# Patient Record
Sex: Female | Born: 1945
Health system: Southern US, Community
[De-identification: ages and names within clinical notes are randomized; demographics above are authoritative.]

## PROBLEM LIST (undated history)

## (undated) DIAGNOSIS — T783XXA Angioneurotic edema, initial encounter: Secondary | ICD-10-CM

## (undated) DIAGNOSIS — I10 Essential (primary) hypertension: Secondary | ICD-10-CM

## (undated) DIAGNOSIS — M8580 Other specified disorders of bone density and structure, unspecified site: Secondary | ICD-10-CM

## (undated) DIAGNOSIS — E7800 Pure hypercholesterolemia, unspecified: Secondary | ICD-10-CM

## (undated) DIAGNOSIS — T7840XA Allergy, unspecified, initial encounter: Secondary | ICD-10-CM

## (undated) DIAGNOSIS — K219 Gastro-esophageal reflux disease without esophagitis: Secondary | ICD-10-CM

## (undated) HISTORY — DX: Other specified disorders of bone density and structure, unspecified site: M85.80

## (undated) HISTORY — DX: Angioneurotic edema, initial encounter: T78.3XXA

## (undated) HISTORY — DX: Essential (primary) hypertension: I10

## (undated) HISTORY — DX: Gastro-esophageal reflux disease without esophagitis: K21.9

## (undated) HISTORY — PX: TUBAL LIGATION: SHX77

## (undated) HISTORY — DX: Pure hypercholesterolemia, unspecified: E78.00

## (undated) HISTORY — DX: Allergy, unspecified, initial encounter: T78.40XA

---

## 2000-02-10 ENCOUNTER — Other Ambulatory Visit: Admission: RE | Admit: 2000-02-10 | Discharge: 2000-02-10 | Payer: Self-pay | Admitting: Gynecology

## 2000-03-04 ENCOUNTER — Other Ambulatory Visit: Admission: RE | Admit: 2000-03-04 | Discharge: 2000-03-04 | Payer: Self-pay | Admitting: Gynecology

## 2000-03-04 ENCOUNTER — Encounter (INDEPENDENT_AMBULATORY_CARE_PROVIDER_SITE_OTHER): Payer: Self-pay | Admitting: Specialist

## 2001-02-11 ENCOUNTER — Other Ambulatory Visit: Admission: RE | Admit: 2001-02-11 | Discharge: 2001-02-11 | Payer: Self-pay | Admitting: Gynecology

## 2001-07-21 ENCOUNTER — Encounter: Admission: RE | Admit: 2001-07-21 | Discharge: 2001-07-21 | Payer: Self-pay | Admitting: General Surgery

## 2001-07-21 ENCOUNTER — Encounter: Payer: Self-pay | Admitting: General Surgery

## 2002-02-15 ENCOUNTER — Encounter: Admission: RE | Admit: 2002-02-15 | Discharge: 2002-02-15 | Payer: Self-pay | Admitting: Gynecology

## 2002-02-15 ENCOUNTER — Encounter: Payer: Self-pay | Admitting: Gynecology

## 2002-09-05 ENCOUNTER — Other Ambulatory Visit: Admission: RE | Admit: 2002-09-05 | Discharge: 2002-09-05 | Payer: Self-pay | Admitting: Gynecology

## 2004-03-28 ENCOUNTER — Encounter: Admission: RE | Admit: 2004-03-28 | Discharge: 2004-03-28 | Payer: Self-pay | Admitting: Gynecology

## 2004-04-21 ENCOUNTER — Other Ambulatory Visit: Admission: RE | Admit: 2004-04-21 | Discharge: 2004-04-21 | Payer: Self-pay | Admitting: Gynecology

## 2005-04-22 ENCOUNTER — Encounter: Admission: RE | Admit: 2005-04-22 | Discharge: 2005-04-22 | Payer: Self-pay | Admitting: Gynecology

## 2005-04-22 ENCOUNTER — Other Ambulatory Visit: Admission: RE | Admit: 2005-04-22 | Discharge: 2005-04-22 | Payer: Self-pay | Admitting: Gynecology

## 2005-06-29 ENCOUNTER — Emergency Department (HOSPITAL_COMMUNITY): Admission: EM | Admit: 2005-06-29 | Discharge: 2005-06-29 | Payer: Self-pay | Admitting: Emergency Medicine

## 2006-06-04 ENCOUNTER — Encounter: Admission: RE | Admit: 2006-06-04 | Discharge: 2006-06-04 | Payer: Self-pay | Admitting: Gynecology

## 2006-06-07 ENCOUNTER — Other Ambulatory Visit: Admission: RE | Admit: 2006-06-07 | Discharge: 2006-06-07 | Payer: Self-pay | Admitting: Gynecology

## 2006-07-28 IMAGING — CR DG CHEST 1V PORT
1 series · 1 of 1 positions shown · non-contrast
Comparison: None.

CLINICAL DATA: Sharp chest pain. 
 PORTABLE CHEST ? 06/29/05:

[view not recorded]
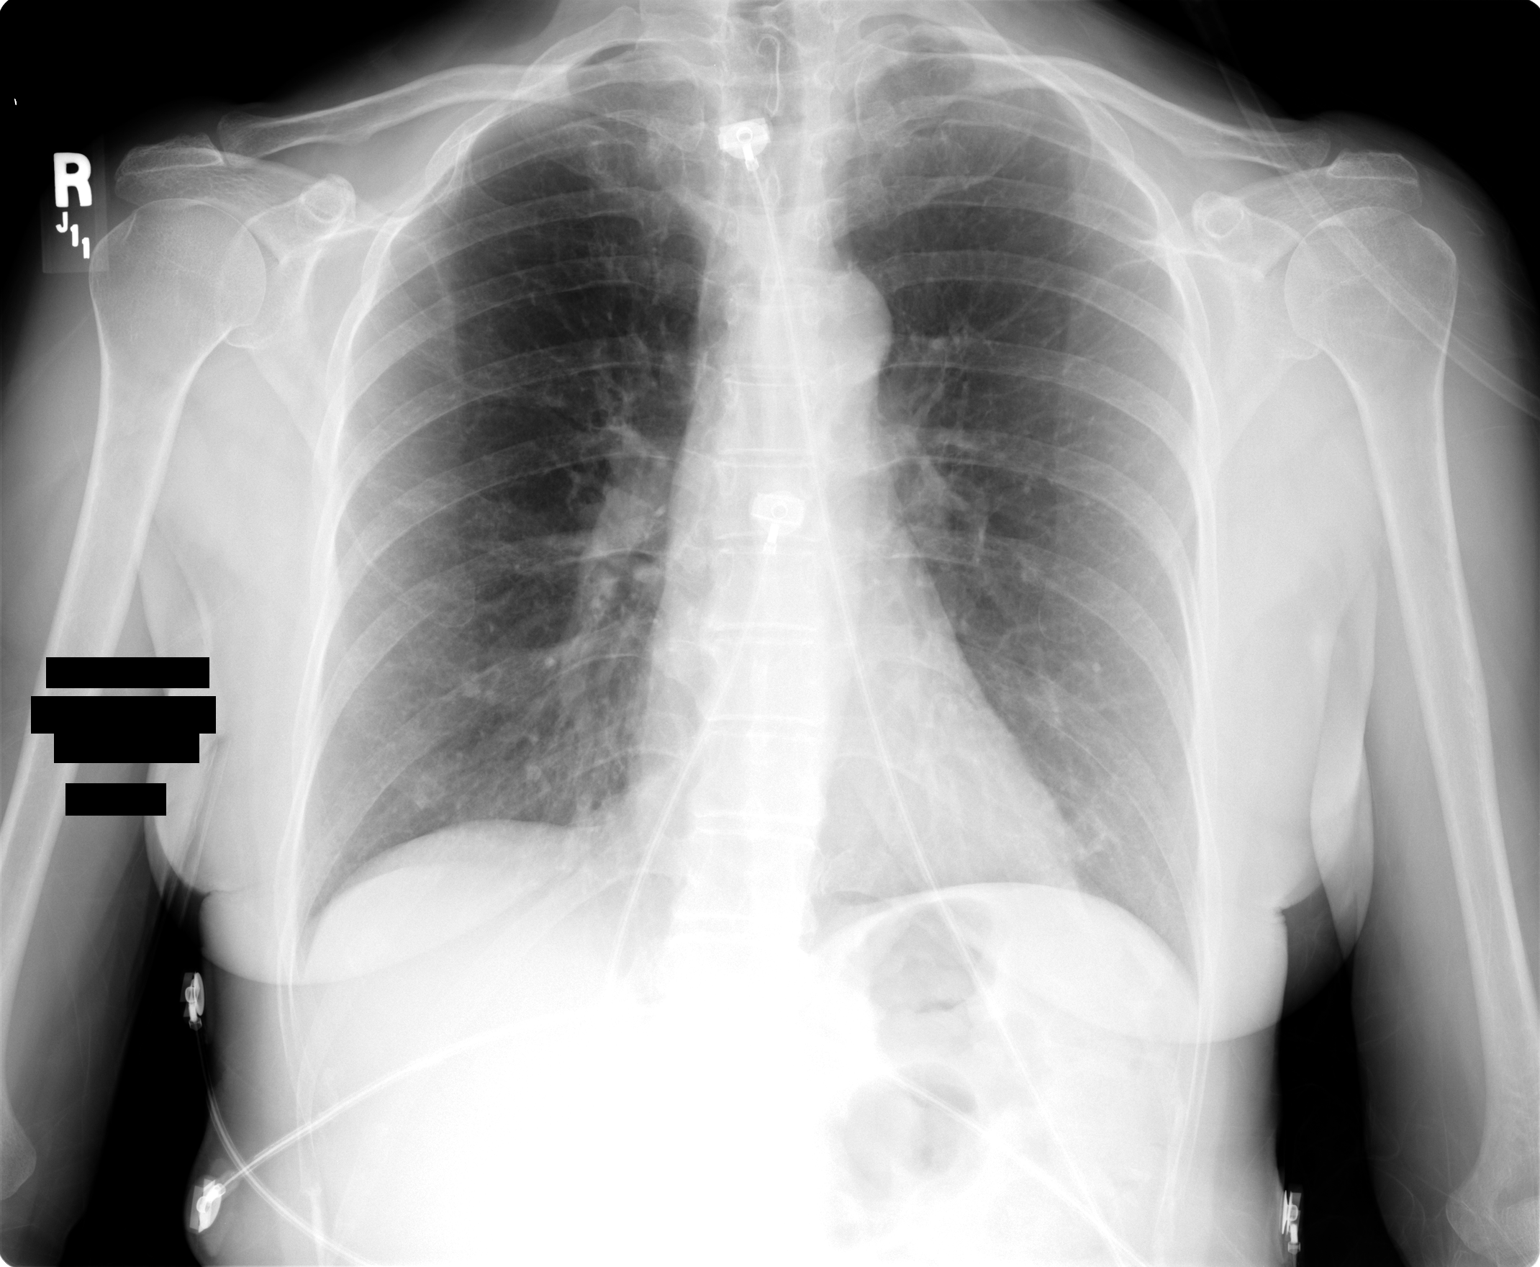

[1 of 1 positions shown; findings below may reference images not displayed]

The heart size and mediastinal contours are within normal limits.  Both lungs are clear.  Apparent opacities over the bilateral lower lobes most likely represent costochondral calcification involving the anterior 6th ribs bilaterally.
IMPRESSION: No acute findings.

## 2007-07-01 ENCOUNTER — Other Ambulatory Visit: Admission: RE | Admit: 2007-07-01 | Discharge: 2007-07-01 | Payer: Self-pay | Admitting: Gynecology

## 2007-07-08 ENCOUNTER — Encounter: Admission: RE | Admit: 2007-07-08 | Discharge: 2007-07-08 | Payer: Self-pay | Admitting: Gynecology

## 2008-04-22 ENCOUNTER — Emergency Department (HOSPITAL_COMMUNITY): Admission: EM | Admit: 2008-04-22 | Discharge: 2008-04-23 | Payer: Self-pay | Admitting: Emergency Medicine

## 2008-04-29 ENCOUNTER — Emergency Department (HOSPITAL_COMMUNITY): Admission: EM | Admit: 2008-04-29 | Discharge: 2008-04-29 | Payer: Self-pay | Admitting: Emergency Medicine

## 2008-07-09 ENCOUNTER — Ambulatory Visit: Payer: Self-pay | Admitting: Women's Health

## 2008-07-09 ENCOUNTER — Other Ambulatory Visit: Admission: RE | Admit: 2008-07-09 | Discharge: 2008-07-09 | Payer: Self-pay | Admitting: Gynecology

## 2008-07-09 ENCOUNTER — Encounter: Payer: Self-pay | Admitting: Women's Health

## 2008-07-09 ENCOUNTER — Encounter: Admission: RE | Admit: 2008-07-09 | Discharge: 2008-07-09 | Payer: Self-pay | Admitting: Gynecology

## 2008-07-23 ENCOUNTER — Ambulatory Visit: Payer: Self-pay | Admitting: Women's Health

## 2008-07-31 ENCOUNTER — Ambulatory Visit: Payer: Self-pay | Admitting: Gynecology

## 2009-07-10 ENCOUNTER — Encounter: Admission: RE | Admit: 2009-07-10 | Discharge: 2009-07-10 | Payer: Self-pay | Admitting: Gynecology

## 2009-07-10 ENCOUNTER — Encounter: Payer: Self-pay | Admitting: Women's Health

## 2009-07-10 ENCOUNTER — Ambulatory Visit: Payer: Self-pay | Admitting: Women's Health

## 2009-07-10 ENCOUNTER — Other Ambulatory Visit: Admission: RE | Admit: 2009-07-10 | Discharge: 2009-07-10 | Payer: Self-pay | Admitting: Gynecology

## 2010-07-11 ENCOUNTER — Encounter: Admission: RE | Admit: 2010-07-11 | Discharge: 2010-07-11 | Payer: Self-pay | Admitting: Obstetrics and Gynecology

## 2010-07-11 ENCOUNTER — Other Ambulatory Visit: Admission: RE | Admit: 2010-07-11 | Discharge: 2010-07-11 | Payer: Self-pay | Admitting: Gynecology

## 2010-07-11 ENCOUNTER — Ambulatory Visit: Payer: Self-pay | Admitting: Women's Health

## 2010-08-19 ENCOUNTER — Ambulatory Visit: Payer: Self-pay | Admitting: Women's Health

## 2010-09-28 HISTORY — PX: SKIN CANCER DESTRUCTION: SHX778

## 2010-09-28 HISTORY — PX: BREAST BIOPSY: SHX20

## 2010-09-30 ENCOUNTER — Ambulatory Visit
Admission: RE | Admit: 2010-09-30 | Discharge: 2010-09-30 | Payer: Self-pay | Source: Home / Self Care | Attending: Gynecology | Admitting: Gynecology

## 2011-02-13 NOTE — Procedures (Signed)
NAME:  Jackie Jackson, [name]               ACCOUNT NO.:  [id number]   MEDICAL RECORD NO.:  [id number]          PATIENT TYPE:  EMS   LOCATION:  ED                            FACILITY:  [facility]   PHYSICIAN:  Edward L. Juanetta Gosling, M.D.DATE OF BIRTH:  [birth year]   DATE OF PROCEDURE:  DATE OF DISCHARGE:  06/29/2005                                EKG INTERPRETATION   Time is 11:19 June 29, 2005. The rhythm is sinus rhythm with a rate in the  70s. There is inferior ST elevation which is nonspecific. Abnormal  electrocardiogram.      Oneal Deputy. Juanetta Gosling, M.D.  Electronically Signed     ELH/MEDQ  D:  07/08/2005  T:  07/08/2005  Job:  [id number]

## 2011-02-13 NOTE — Procedures (Signed)
NAME:  Jackie Jackson, Jackie Jackson               ACCOUNT NO.:  [id number]   MEDICAL RECORD NO.:  [id number]          PATIENT TYPE:  EMS   LOCATION:  ED                            FACILITY:  [facility]   PHYSICIAN:  Edward L. Juanetta Gosling, M.D.DATE OF BIRTH:  1946-06-04   DATE OF PROCEDURE:  DATE OF DISCHARGE:  06/29/2005                                EKG INTERPRETATION   Time 11:19, June 29, 2005. The rhythm is sinus rhythm with a rate in the  70s. There is inferior ST elevation, but no other ST-T wave changes to  suggest that this might represent a myocardial infarction, and my impression  is that this is probably early repolarization artifact, etc. Clinical  correlation is however suggested. Abnormal electrocardiogram.      Oneal Deputy. Juanetta Gosling, M.D.  Electronically Signed     ELH/MEDQ  D:  07/08/2005  T:  07/09/2005  Job:  [id number]

## 2011-06-26 LAB — POCT CARDIAC MARKERS
CKMB, poc: <1 — ABNORMAL LOW
CKMB, poc: <1 — ABNORMAL LOW

## 2011-07-20 ENCOUNTER — Other Ambulatory Visit: Payer: Self-pay | Admitting: Women's Health

## 2011-07-20 DIAGNOSIS — Z1231 Encounter for screening mammogram for malignant neoplasm of breast: Secondary | ICD-10-CM

## 2011-08-04 ENCOUNTER — Encounter: Payer: Self-pay | Admitting: *Deleted

## 2011-08-04 DIAGNOSIS — M8580 Other specified disorders of bone density and structure, unspecified site: Secondary | ICD-10-CM | POA: Insufficient documentation

## 2011-08-04 DIAGNOSIS — E7800 Pure hypercholesterolemia, unspecified: Secondary | ICD-10-CM | POA: Insufficient documentation

## 2011-08-04 DIAGNOSIS — K219 Gastro-esophageal reflux disease without esophagitis: Secondary | ICD-10-CM | POA: Insufficient documentation

## 2011-08-05 ENCOUNTER — Ambulatory Visit
Admission: RE | Admit: 2011-08-05 | Discharge: 2011-08-05 | Disposition: A | Discharge status: Home / Self Care | Payer: Medicare Other | Source: Ambulatory Visit | Attending: Women's Health | Admitting: Women's Health

## 2011-08-05 ENCOUNTER — Other Ambulatory Visit (HOSPITAL_COMMUNITY)
Admission: RE | Admit: 2011-08-05 | Discharge: 2011-08-05 | Disposition: A | Discharge status: Home / Self Care | Payer: Medicare Other | Source: Ambulatory Visit | Attending: Gynecology | Admitting: Gynecology

## 2011-08-05 ENCOUNTER — Ambulatory Visit (INDEPENDENT_AMBULATORY_CARE_PROVIDER_SITE_OTHER): Payer: Medicare Other | Admitting: Women's Health

## 2011-08-05 ENCOUNTER — Encounter: Payer: Self-pay | Admitting: Women's Health

## 2011-08-05 VITALS — BP 130/70 | Ht 62.25 in | Wt 136.0 lb

## 2011-08-05 DIAGNOSIS — C801 Malignant (primary) neoplasm, unspecified: Secondary | ICD-10-CM | POA: Insufficient documentation

## 2011-08-05 DIAGNOSIS — Z1231 Encounter for screening mammogram for malignant neoplasm of breast: Secondary | ICD-10-CM

## 2011-08-05 DIAGNOSIS — Z124 Encounter for screening for malignant neoplasm of cervix: Secondary | ICD-10-CM

## 2011-08-05 DIAGNOSIS — N951 Menopausal and female climacteric states: Secondary | ICD-10-CM

## 2011-08-05 DIAGNOSIS — Z01419 Encounter for gynecological examination (general) (routine) without abnormal findings: Secondary | ICD-10-CM

## 2011-08-05 NOTE — Progress Notes (Signed)
Jackie Jackson June 21, 1946 [id number]    History:    The patient presents for breast exam and Pap.   Past medical history, past surgical history, family history and social history were all reviewed and documented in the EPIC chart.   ROS:  A  ROS was performed and pertinent positives and negatives are included in the history.  Exam:  Filed Vitals:   08/05/11 1050  BP: 130/70    General appearance:  Normal Head/Neck:  Normal, without cervical or supraclavicular adenopathy. Thyroid:  Symmetrical, normal in size, without palpable masses or nodularity. Respiratory  Effort:  Normal  Auscultation:  Clear without wheezing or rhonchi Cardiovascular  Auscultation:  Regular rate, without rubs, murmurs or gallops  Edema/varicosities:  Not grossly evident Abdominal  Soft,nontender, without masses, guarding or rebound.  Liver/spleen:  No organomegaly noted  Hernia:  None appreciated  Skin  Inspection:  Grossly normal  Palpation:  Grossly normal Neurologic/psychiatric  Orientation:  Normal with appropriate conversation.  Mood/affect:  Normal  Genitourinary    Breasts: Examined lying and sitting.     Right: Without masses, retractions, discharge or axillary adenopathy.     Left: Without masses, retractions, discharge or axillary adenopathy.   Inguinal/mons:  Normal without inguinal adenopathy  External genitalia:  Normal  BUS/Urethra/Skene's glands:  Normal  Bladder:  Normal  Vagina:  Normal  Cervix:  Normal  Uterus:  retroverted, normal in size, shape and contour.  Midline and mobile  Adnexa/parametria:     Rt: Without masses or tenderness.   Lt: Without masses or tenderness.  Anus and perineum: Normal  Digital rectal exam: Normal sphincter tone without palpated masses or tenderness  Assessment/Plan:  65 y.o.[name] G2P2 postmenopausal with no bleeding on no HRT. Diagnosis this year with skin cancer she thinks is basal, she does have followup with Dr. Arbie Jackson and Jackie Jackson and will  continue every 3-6 months.  Not sexually active due to her husband's health. History of normal Paps and mammograms.  Normal Postmenopausal exam Osteopenia T score -1.9 Hyperlipidemia/acid reflux-labs and meds at primary care  Plan: DEXA, repeat 11/ 2013. Home safety and fall prevention discussed. Reviewed importance of exercise, calcium rich diet, vitamin D 1000 daily. SBEs, annual mammogram which she is having today. She has not had a colonoscopy will schedule this year. Instructed to followup with primary care for zostovac and Pneumovax vaccine. Pap only today labs are done at primary care.    Harrington Challenger [facility], 11:41 AM 08/05/2011

## 2011-08-05 NOTE — Patient Instructions (Signed)
Pnuemovac/ Pnuemonia vaccine  zostovac

## 2011-08-13 ENCOUNTER — Other Ambulatory Visit: Payer: Self-pay

## 2011-08-13 DIAGNOSIS — N63 Unspecified lump in unspecified breast: Secondary | ICD-10-CM

## 2011-08-28 ENCOUNTER — Ambulatory Visit
Admission: RE | Admit: 2011-08-28 | Discharge: 2011-08-28 | Disposition: A | Discharge status: Home / Self Care | Payer: Medicare Other | Source: Ambulatory Visit | Attending: Gynecology | Admitting: Gynecology

## 2011-08-28 ENCOUNTER — Other Ambulatory Visit: Payer: Self-pay | Admitting: Gynecology

## 2011-08-28 DIAGNOSIS — N63 Unspecified lump in unspecified breast: Secondary | ICD-10-CM

## 2012-01-27 ENCOUNTER — Other Ambulatory Visit: Payer: Self-pay | Admitting: Physician Assistant

## 2012-07-21 ENCOUNTER — Other Ambulatory Visit: Payer: Self-pay | Admitting: Women's Health

## 2012-07-21 DIAGNOSIS — Z1231 Encounter for screening mammogram for malignant neoplasm of breast: Secondary | ICD-10-CM

## 2012-08-17 ENCOUNTER — Encounter: Payer: Self-pay | Admitting: Women's Health

## 2012-08-17 ENCOUNTER — Encounter: Payer: Self-pay | Admitting: Gynecology

## 2012-08-17 ENCOUNTER — Ambulatory Visit (INDEPENDENT_AMBULATORY_CARE_PROVIDER_SITE_OTHER): Payer: Medicare Other | Admitting: Women's Health

## 2012-08-17 VITALS — BP 136/78 | Ht 62.5 in | Wt 139.0 lb

## 2012-08-17 DIAGNOSIS — M8580 Other specified disorders of bone density and structure, unspecified site: Secondary | ICD-10-CM

## 2012-08-17 DIAGNOSIS — M949 Disorder of cartilage, unspecified: Secondary | ICD-10-CM

## 2012-08-17 NOTE — Patient Instructions (Addendum)

## 2012-08-17 NOTE — Progress Notes (Signed)
Jackie Jackson August 24, 1946 [id number]    History:    The patient presents for breast and pelvic exam. Postmenopausal with no bleeding on no HRT. History of normal Paps. Mammogram 2012 negative biopsy. History of basal skin cancer on lower leg and nose. Osteopenia T score -1.8 at the [facility] spine in 2011 with no increased fracture risk, normal calcium, vitamin D, and PTH. History of hypercholesteremia currently on Lipitor per primary care. Not sexually active due to husbands health.  Past medical history, past surgical history, family history and social history were all reviewed and documented in the EPIC chart. Retired from [employer]. Helping care for 20 yo mother who had heart surgery.  Exam:  Filed Vitals:   08/17/12 1056  BP: 136/78    General appearance:  Normal Head/Neck:  Normal, without cervical or supraclavicular adenopathy. Thyroid:  Symmetrical, normal in size, without palpable masses or nodularity. Respiratory  Effort:  Normal  Auscultation:  Clear without wheezing or rhonchi Cardiovascular  Auscultation:  Regular rate, without rubs, murmurs or gallops  Edema/varicosities:  Not grossly evident Abdominal  Soft,nontender, without masses, guarding or rebound.  Liver/spleen:  No organomegaly noted  Hernia:  None appreciated  Skin  Inspection:  Grossly normal  Palpation:  Grossly normal Neurologic/psychiatric  Orientation:  Normal with appropriate conversation.  Mood/affect:  Normal  Genitourinary    Breasts: Examined lying and sitting.     Right: Without masses, retractions, discharge or axillary adenopathy.     Left: Without masses, retractions, discharge or axillary adenopathy.   Inguinal/mons:  Normal without inguinal adenopathy  External genitalia:  Normal  BUS/Urethra/Skene's glands:  Normal  Bladder:  Normal  Vagina:  Normal  Cervix:  Normal  Uterus:  normal in size, shape and contour.  Midline and mobile  Adnexa/parametria:     Rt: Without masses or  tenderness.   Lt: Without masses or tenderness.  Anus and perineum: Normal  Digital rectal exam: Normal sphincter tone without palpated masses or tenderness  Assessment/Plan:  65 y.o. M. WF G2 P2  for breast and pelvic exam.   Normal post menopausal exam Osteopenia T score -1.9 at right femoral neck with no increased fracture risk Acid reflux-primary care Hypercholesteremia-Lipitor primary care labs and meds  Plan: Instructed to schedule colonoscopy states had to cancel due to mother's health. Is aware of importance and will reschedule. SBE's, continue annual mammogram, calcium rich diet, vitamin D 2000 daily and continue regular exercise encouraged. Labs at primary care, Pap normal 2012, reviewed new screening guidelines. Repeat DEXA, will schedule. Home safety and fall prevention discussed. Reviewed Pneumovax and Zostavac vaccine.    Harrington Challenger [facility], 11:59 AM 08/17/2012

## 2012-08-24 ENCOUNTER — Ambulatory Visit
Admission: RE | Admit: 2012-08-24 | Discharge: 2012-08-24 | Disposition: A | Discharge status: Home / Self Care | Payer: Medicare Other | Source: Ambulatory Visit | Attending: Women's Health | Admitting: Women's Health

## 2012-08-24 DIAGNOSIS — Z1231 Encounter for screening mammogram for malignant neoplasm of breast: Secondary | ICD-10-CM

## 2013-03-07 ENCOUNTER — Encounter (HOSPITAL_COMMUNITY): Payer: Self-pay

## 2013-03-07 ENCOUNTER — Emergency Department (HOSPITAL_COMMUNITY)
Admission: EM | Admit: 2013-03-07 | Discharge: 2013-03-07 | Disposition: A | Discharge status: Home / Self Care | Payer: Medicare PPO | Attending: Emergency Medicine | Admitting: Emergency Medicine

## 2013-03-07 ENCOUNTER — Emergency Department (HOSPITAL_COMMUNITY): Payer: Medicare PPO

## 2013-03-07 DIAGNOSIS — E7800 Pure hypercholesterolemia, unspecified: Secondary | ICD-10-CM | POA: Insufficient documentation

## 2013-03-07 DIAGNOSIS — R0602 Shortness of breath: Secondary | ICD-10-CM | POA: Insufficient documentation

## 2013-03-07 DIAGNOSIS — Z8739 Personal history of other diseases of the musculoskeletal system and connective tissue: Secondary | ICD-10-CM | POA: Insufficient documentation

## 2013-03-07 DIAGNOSIS — R072 Precordial pain: Secondary | ICD-10-CM | POA: Insufficient documentation

## 2013-03-07 DIAGNOSIS — R079 Chest pain, unspecified: Secondary | ICD-10-CM

## 2013-03-07 DIAGNOSIS — Z79899 Other long term (current) drug therapy: Secondary | ICD-10-CM | POA: Insufficient documentation

## 2013-03-07 DIAGNOSIS — Z859 Personal history of malignant neoplasm, unspecified: Secondary | ICD-10-CM | POA: Insufficient documentation

## 2013-03-07 DIAGNOSIS — K219 Gastro-esophageal reflux disease without esophagitis: Secondary | ICD-10-CM | POA: Insufficient documentation

## 2013-03-07 DIAGNOSIS — M549 Dorsalgia, unspecified: Secondary | ICD-10-CM | POA: Insufficient documentation

## 2013-03-07 LAB — CBC WITH DIFFERENTIAL/PLATELET
Basophils Absolute: 0 10*3/uL (ref 0.0–0.1)
Basophils Relative: 1 % (ref 0–1)
Eosinophils Absolute: 0.1 10*3/uL (ref 0.0–0.7)
Eosinophils Relative: 1 % (ref 0–5)
HCT: 40.3 % (ref 36.0–46.0)
Hemoglobin: 13.6 g/dL (ref 12.0–15.0)
Lymphocytes Relative: 47 % — ABNORMAL HIGH (ref 12–46)
Lymphs Abs: 3 10*3/uL (ref 0.7–4.0)
MCH: 30.4 pg (ref 26.0–34.0)
MCHC: 33.7 g/dL (ref 30.0–36.0)
MCV: 90.2 fL (ref 78.0–100.0)
Monocytes Absolute: 0.5 10*3/uL (ref 0.1–1.0)
Monocytes Relative: 7 % (ref 3–12)
Neutro Abs: 2.9 10*3/uL (ref 1.7–7.7)
Neutrophils Relative %: 44 % (ref 43–77)
Platelets: 284 10*3/uL (ref 150–400)
RBC: 4.47 MIL/uL (ref 3.87–5.11)
RDW: 13.3 % (ref 11.5–15.5)
WBC: 6.4 10*3/uL (ref 4.0–10.5)

## 2013-03-07 LAB — BASIC METABOLIC PANEL
BUN: 14 mg/dL (ref 6–23)
CO2: 28 mEq/L (ref 19–32)
Calcium: 10.2 mg/dL (ref 8.4–10.5)
Chloride: 99 mEq/L (ref 96–112)
Creatinine, Ser: 0.81 mg/dL (ref 0.50–1.10)
GFR calc Af Amer: 85 mL/min — ABNORMAL LOW (ref >90)
GFR calc non Af Amer: 73 mL/min — ABNORMAL LOW (ref >90)
Glucose, Bld: 103 mg/dL — ABNORMAL HIGH (ref 70–99)
Potassium: 4.3 mEq/L (ref 3.5–5.1)
Sodium: 139 mEq/L (ref 135–145)

## 2013-03-07 LAB — TROPONIN I: Troponin I: <0.3 ng/mL (ref <0.30)

## 2013-03-07 LAB — POCT I-STAT TROPONIN I: Troponin i, poc: 0 ng/mL (ref 0.00–0.08)

## 2013-03-07 MED ORDER — IOHEXOL 350 MG/ML SOLN
100.0000 mL | Freq: Once | INTRAVENOUS | Status: AC | PRN
  Administered 2013-03-07: 100 mL via INTRAVENOUS

## 2013-03-07 MED ORDER — GI COCKTAIL ~~LOC~~
30.0000 mL | Freq: Once | ORAL | Status: AC
  Administered 2013-03-07: 30 mL via ORAL
  Filled 2013-03-07: qty 30

## 2013-03-07 NOTE — ED Provider Notes (Signed)
History  This chart was scribed for Jackie Razor, MD by Greggory Stallion, ED Scribe. This patient was seen in room [facility] and the patient's care was started at 9:03 PM.  CSN: [id number]  Arrival date & time 03/07/13  2052    Chief Complaint  Patient presents with  . Chest Pain    The history is provided by the patient. No language interpreter was used.    HPI Comments: Jackie Jackson is a 67 y.o. female with h/o acid reflux who presents to the Emergency Department complaining of sudden onset, constant substernal chest pain that radiated to her back and throat that started earlier tonight around 6 PM after she ate dinner. She states she still has a feeling in her throat but the CP has resolved. She states she took acid reflux medication before she came to the ED. Pt states she is SOB but she states she is always that way. Pt denies fever, leg swelling, neck pain, sore throat, visual disturbance, cough, abdominal pain, nausea, emesis, diarrhea, urinary symptoms, HA, weakness, numbness and rash as associated symptoms. She states she has not had a stress test.   Past Medical History  Diagnosis Date  . Osteopenia     2006 -1.4, 2009 -1.9, 2011 -1.9  . Acid reflux   . High cholesterol   . Cancer 2012    skin cancer    Past Surgical History  Procedure Laterality Date  . Tubal ligation    . Skin cancer destruction  2012    rt. lower leg    Family History  Problem Relation Age of Onset  . Heart disease Father   . Diabetes Brother   . Heart disease Mother     History  Substance Use Topics  . Smoking status: Never Smoker   . Smokeless tobacco: Never Used  . Alcohol Use: No    OB History   Grav Para Term Preterm Abortions TAB SAB Ect Mult Living   2 2        2       Review of Systems  Constitutional: Negative for fever.  HENT: Negative for sore throat and neck pain.   Eyes: Negative for visual disturbance.  Respiratory: Positive for shortness of breath. Negative for  cough.   Cardiovascular: Positive for chest pain. Negative for leg swelling.  Gastrointestinal: Negative for nausea, vomiting, abdominal pain and diarrhea.  Genitourinary: Negative for dysuria and difficulty urinating.  Musculoskeletal: Positive for back pain.  Skin: Negative for rash.  Neurological: Negative for headaches.  All other systems reviewed and are negative.    Allergies  Pantoprazole sodium  Home Medications   Current Outpatient Rx  Name  Route  Sig  Dispense  Refill  . atorvastatin (LIPITOR) 10 MG tablet   Oral   Take 10 mg by mouth daily.           . calcium carbonate (OS-CAL) 600 MG TABS   Oral   Take 600 mg by mouth daily.          . cholecalciferol (VITAMIN D) 1000 UNITS tablet   Oral   Take 1,000 Units by mouth daily.           . fish oil-omega-3 fatty acids 1000 MG capsule   Oral   Take 2 g by mouth daily.           . metoCLOPramide (REGLAN) 10 MG tablet   Oral   Take 10 mg by mouth 4 (four) times daily.           Marland Kitchen  ranitidine (ZANTAC) 150 MG tablet   Oral   Take 150 mg by mouth 2 (two) times daily.           . Red Yeast Rice Extract 600 MG CAPS   Oral   Take by mouth. 2 daily          . vitamin B-12 (CYANOCOBALAMIN) 500 MCG tablet   Oral   Take 500 mcg by mouth daily.             BP 108/81  Pulse 88  Temp(Src) 98.4 F (36.9 C) (Oral)  Resp 20  Ht 5' 2.5" (1.588 m)  Wt 131 lb (59.421 kg)  BMI 23.56 kg/m2  SpO2 97%  LMP 08/04/1997  Physical Exam  Nursing note and vitals reviewed. Constitutional: She appears well-developed and well-nourished. No distress.  HENT:  Head: Normocephalic and atraumatic.  Right Ear: External ear normal.  Left Ear: External ear normal.  Eyes: Conjunctivae are normal. Right eye exhibits no discharge. Left eye exhibits no discharge. No scleral icterus.  Neck: Normal range of motion. Neck supple. No tracheal deviation present.  Cardiovascular: Normal rate, regular rhythm and intact distal  pulses.   Symmetric radial pulses. No significant BP differential in upper extremities.  Pulmonary/Chest: Effort normal and breath sounds normal. No stridor. No respiratory distress. She has no wheezes. She has no rales.  Abdominal: Soft. Bowel sounds are normal. She exhibits no distension. There is no tenderness. There is no rebound and no guarding.  Musculoskeletal: She exhibits no edema and no tenderness.  Neurological: She is alert. She has normal strength. No sensory deficit. Cranial nerve deficit:  no gross defecits noted. She exhibits normal muscle tone. She displays no seizure activity. Coordination normal.  Skin: Skin is warm and dry. No rash noted.  Psychiatric: She has a normal mood and affect.    ED Course  Procedures (including critical care time)  DIAGNOSTIC STUDIES: Oxygen Saturation is 97% on RA, normal by my interpretation.    COORDINATION OF CARE: 9:58 PM-Discussed treatment plan with pt at bedside and pt agreed to plan.   Labs Reviewed  CBC WITH DIFFERENTIAL - Abnormal; Notable for the following:    Lymphocytes Relative 47 (*)    All other components within normal limits  BASIC METABOLIC PANEL - Abnormal; Notable for the following:    Glucose, Bld 103 (*)    GFR calc non Af Amer 73 (*)    GFR calc Af Amer 85 (*)    All other components within normal limits  TROPONIN I  POCT I-STAT TROPONIN I   No results found.  Dg Chest 2 View  03/07/2013   *RADIOLOGY REPORT*  Clinical Data: Substernal chest pain radiating to the back and throat since 1800.  Shortness of breath.  CHEST - 2 VIEW  Comparison: 04/22/2008  Findings: Emphysematous changes and scattered fibrosis in the chest. The heart size and pulmonary vascularity are normal. The lungs appear clear and expanded without focal air space disease or consolidation. No blunting of the costophrenic angles.  No pneumothorax.  Mediastinal contours appear intact.  No significant changes since previous study.  IMPRESSION:  Emphysematous changes in the lungs.  No evidence of active pulmonary disease.   Original Report Authenticated By: Burman Nieves, M.D.   Ct Angio Chest W/cm &/or Wo Cm  03/07/2013   *RADIOLOGY REPORT*  Clinical Data:  Substernal chest pain radiating into the back.  CT ANGIOGRAPHY CHEST, ABDOMEN AND PELVIS  Technique:  Multidetector CT imaging through the  chest, abdomen and pelvis was performed using the standard protocol during bolus administration of intravenous contrast.  Multiplanar reconstructed images including MIPs were obtained and reviewed to evaluate the vascular anatomy.  Contrast: OMNIPAQUE IOHEXOL 350 MG/ML SOLN  Comparison:   None.  CTA CHEST  Findings:  The thoracic aorta is of normal caliber and demonstrates no evidence of aneurysmal disease or dissection.  Unenhanced imaging prior to administration of contrast shows no evidence of intramural hemorrhage.  There is incidental normal variant of an aberrant right subclavian artery which is the last vessel emanating from the aortic arch and has a retroesophageal course.  Other great vessel course and patency is normal.  Nonspecific somewhat prominent appearance of the mid to distal esophagus beginning just below the carina.  Correlation suggested with any symptoms referable to the esophagus.  The esophagus may be thickened in its distal segment.  There is no evidence of pneumomediastinum to suggest esophageal perforation.  The heart size is normal.  No nodules, infiltrates or edema are identified.  No enlarged lymph nodes are seen.  No bony abnormalities are identified.   Review of the MIP images confirms the above findings.  IMPRESSION:  1.  Normal thoracic aorta. 2.  Aberrant right subclavian artery. 3.  Somewhat thickened appearance of the mid to distal esophagus by CT.  This is nonspecific.  Correlation suggested with any symptoms that might be referable to the esophagus.  CTA ABDOMEN AND PELVIS  Findings:  The abdominal aorta is of normal  caliber and shows no evidence of aneurysm or dissection.  Minimal scattered atherosclerotic plaque present.  The origin of the celiac axis shows focal plaque and minimal, roughly 30% stenosis.  The superior mesenteric artery is normally patent.  Two separate right renal arteries and a single left renal artery show normal patency.  The inferior mesenteric artery is normally patent.  Iliac arteries are normally patent bilaterally.  The common femoral arteries show normal patency.  Nonvascular valuation shows no solid organ abnormalities on the arterial phase of imaging.  Bowel loops are normal caliber.  No abnormal fluid collections are seen.  The bladder is unremarkable. No hernias, incidental masses or enlarged lymph nodes are seen. Bony structures are unremarkable.   Review of the MIP images confirms the above findings.  IMPRESSION: No evidence of aortic aneurysm or dissection.   Original Report Authenticated By: Irish Lack, M.D.   Ct Angio Abd/pel W/ And/or W/o  03/07/2013   *RADIOLOGY REPORT*  Clinical Data:  Substernal chest pain radiating into the back.  CT ANGIOGRAPHY CHEST, ABDOMEN AND PELVIS  Technique:  Multidetector CT imaging through the chest, abdomen and pelvis was performed using the standard protocol during bolus administration of intravenous contrast.  Multiplanar reconstructed images including MIPs were obtained and reviewed to evaluate the vascular anatomy.  Contrast: OMNIPAQUE IOHEXOL 350 MG/ML SOLN  Comparison:   None.  CTA CHEST  Findings:  The thoracic aorta is of normal caliber and demonstrates no evidence of aneurysmal disease or dissection.  Unenhanced imaging prior to administration of contrast shows no evidence of intramural hemorrhage.  There is incidental normal variant of an aberrant right subclavian artery which is the last vessel emanating from the aortic arch and has a retroesophageal course.  Other great vessel course and patency is normal.  Nonspecific somewhat  prominent appearance of the mid to distal esophagus beginning just below the carina.  Correlation suggested with any symptoms referable to the esophagus.  The esophagus may be thickened  in its distal segment.  There is no evidence of pneumomediastinum to suggest esophageal perforation.  The heart size is normal.  No nodules, infiltrates or edema are identified.  No enlarged lymph nodes are seen.  No bony abnormalities are identified.   Review of the MIP images confirms the above findings.  IMPRESSION:  1.  Normal thoracic aorta. 2.  Aberrant right subclavian artery. 3.  Somewhat thickened appearance of the mid to distal esophagus by CT.  This is nonspecific.  Correlation suggested with any symptoms that might be referable to the esophagus.  CTA ABDOMEN AND PELVIS  Findings:  The abdominal aorta is of normal caliber and shows no evidence of aneurysm or dissection.  Minimal scattered atherosclerotic plaque present.  The origin of the celiac axis shows focal plaque and minimal, roughly 30% stenosis.  The superior mesenteric artery is normally patent.  Two separate right renal arteries and a single left renal artery show normal patency.  The inferior mesenteric artery is normally patent.  Iliac arteries are normally patent bilaterally.  The common femoral arteries show normal patency.  Nonvascular valuation shows no solid organ abnormalities on the arterial phase of imaging.  Bowel loops are normal caliber.  No abnormal fluid collections are seen.  The bladder is unremarkable. No hernias, incidental masses or enlarged lymph nodes are seen. Bony structures are unremarkable.   Review of the MIP images confirms the above findings.  IMPRESSION: No evidence of aortic aneurysm or dissection.   Original Report Authenticated By: Irish Lack, M.D.   EKG:  Rhythm: normal sinus Vent. rate 91 BPM PR interval 128 ms QRS duration 86 ms QT/QTc 354/435 ms LAE Abnormal r wave progression ST segments: NS ST changes    1.  Chest pain       MDM  67yF with CP. Reports previous episodes similar to reflux. May be the Plagge again. I feel low risk for ACS. Doubt PE or infectious. CT w/o evidence of dissection. Pt reports symptoms almost completely resolved.   I personally preformed the services scribed in my presence. The recorded information has been reviewed is accurate. Jackie Razor, MD.   Jackie Razor, MD 03/14/13 [phone]

## 2013-03-07 NOTE — ED Notes (Signed)
Chest pain starting in center of chest, going into my back and in my throat per pt.

## 2013-07-11 ENCOUNTER — Other Ambulatory Visit: Payer: Self-pay | Admitting: Physician Assistant

## 2013-08-09 ENCOUNTER — Other Ambulatory Visit: Payer: Self-pay

## 2013-08-09 DIAGNOSIS — Z1231 Encounter for screening mammogram for malignant neoplasm of breast: Secondary | ICD-10-CM

## 2013-08-18 ENCOUNTER — Encounter: Payer: Self-pay | Admitting: Women's Health

## 2013-08-18 ENCOUNTER — Ambulatory Visit (INDEPENDENT_AMBULATORY_CARE_PROVIDER_SITE_OTHER): Payer: Medicare PPO | Admitting: Women's Health

## 2013-08-18 ENCOUNTER — Other Ambulatory Visit (HOSPITAL_COMMUNITY)
Admission: RE | Admit: 2013-08-18 | Discharge: 2013-08-18 | Disposition: A | Discharge status: Home / Self Care | Payer: Medicare PPO | Source: Ambulatory Visit | Attending: Gynecology | Admitting: Gynecology

## 2013-08-18 VITALS — BP 136/76 | Ht 62.25 in | Wt 141.4 lb

## 2013-08-18 DIAGNOSIS — M8580 Other specified disorders of bone density and structure, unspecified site: Secondary | ICD-10-CM

## 2013-08-18 DIAGNOSIS — M899 Disorder of bone, unspecified: Secondary | ICD-10-CM

## 2013-08-18 DIAGNOSIS — Z124 Encounter for screening for malignant neoplasm of cervix: Secondary | ICD-10-CM | POA: Insufficient documentation

## 2013-08-18 NOTE — Addendum Note (Signed)
Addended by: Richardson Chiquito on: 08/18/2013 01:00 PM   Modules accepted: Orders

## 2013-08-18 NOTE — Patient Instructions (Signed)
Squamous Cell Carcinoma  Squamous cell carcinoma is the second most common form of skin cancer. It begins in the squamous cells in the outer layer of the skin (epidermis).  CAUSES  Ultraviolet light exposure is the most common cause of squamous cell carcinoma. This may come from sunlight or tanning beds. Squamous cell carcinoma is most common in sun-exposed areas like the face, neck, arms, and hands. However, squamous cell carcinoma can occur anywhere on the body, including the lips, inside the mouth, the legs, sites of long-term (chronic) scarring, and the anus.  Other causes of squamous cell carcinoma can include:  Exposure to arsenic.  Exposure to radiation.  Exposure to toxic tars and oils. RISK FACTORS Factors that increase your risk for squamous cell carcinoma include:  Having fair skin.  Being middle-aged or elderly.  Heavy sun exposure, especially during childhood.  Repeated sunburns.  Use of tanning beds.  A weakened immune system. This includes patients who have received a transplant and patients with human immunodeficiency virus (HIV) or acquired immunodeficency syndrome (AIDS).  Human papillomavirus infection.  Conditions that cause chronic scarring. This can include burn scars, chronic ulcers, heat (thermal) injuries, and radiation.  Exposure to psoralen plus ultraviolet A light therapy.  Exposure to chemical carcinogens, such as tar, soot, and arsenic.  Chronic, inflammatory conditions such as lupus, lichen planus, or lichen sclerosus.  Chronic infections, such as infections of the bone (osteomyelitis).  Smoking. SYMPTOMS  Squamous cell carcinoma often starts as small, skin-colored (pink or brown) sandpaper-like growths. These growths are called solar keratoses or actinic keratoses. These growths are often more easily felt than seen.  DIAGNOSIS  Your caregiver may be able to tell what is wrong by doing a physical exam. Often, a tissue sample is also taken. The  tissue sample is examined under a microscope.  TREATMENT  The treatment for squamous cell carcinoma depends on the size and location of the tumors, as well as your overall health. Possible treatments include:   Mohs surgery. This is a procedure done by a skin doctor (dermatologist or Mohs surgeon) in his or her office. The cancerous cells are removed layer by layer.  Laser surgery to remove the tumor.  Freezing the tumor with liquid nitrogen (cryosurgery).  Radiation. This may be used for tumors on the face.  Electrodesiccation and curettage. This involves alternately scraping and burning the tumor, using an electric current to control bleeding. If treated soon enough, squamous cell carcinoma rarely spreads to other areas of the body (metastasizes). If left untreated, however, squamous cell carcinoma will destroy the nearby tissues. This can result in the loss of a nose or ear. PREVENTION  Avoid the sun between 10:00 am and 4:00 pm when it is the strongest.  Use a sunscreen or sunblock with sun protection factor 30 or greater.  Apply sunscreen at least 30 minutes before exposure to the sun.  Reapply sunscreen every 2 to 4 hours while you are outside, after swimming, and after excessive sweating.  Always wear protective hats, clothing, and sunglasses with ultraviolet protection.  Avoid tanning beds. HOME CARE INSTRUCTIONS   Avoid unprotected sun exposure.  Do not smoke.  Follow your caregiver's instructions for self-exams. Look for new growths or changes in your skin.  Keep all follow-up appointments as directed by your caregiver. SEEK MEDICAL CARE IF:   You notice any new growths or changes in your skin.  You have had a squamous cell carcinoma tumor removed and you notice a new growth in   the same location. Document Released: 03/21/2003 Document Revised: 12/07/2011 Document Reviewed: 06/08/2011 [employer] Patient Information 2014 [employer].  

## 2013-08-18 NOTE — Progress Notes (Signed)
Jackie Jackson [birth year] [id number]    History:    The patient presents for breast and pelvic exam. Postmenopausal/no bleeding/no HRT. Has had numerous squamous skin cancer lesions. 2 on her leg with difficulty healing. Normal Pap and mammogram history, negative breast biopsy 07/2012.. Osteopenia stable DEXA, 2011 T score femoral neck -1.9 FRAX 10%/1.4%. Hypercholesteremia managed by primary care. Has not had a colonoscopy.   Past medical history, past surgical history, family history and social history were all reviewed and documented in the EPIC chart. Retired. Helping to care for father in law ignoring self-care and refusing help.   Exam:  Filed Vitals:   08/18/13 1155  BP: 136/76    General appearance:  Normal Head/Neck:  Normal, without cervical or supraclavicular adenopathy. Thyroid:  Symmetrical, normal in size, without palpable masses or nodularity. Respiratory  Effort:  Normal  Auscultation:  Clear without wheezing or rhonchi Cardiovascular  Auscultation:  Regular rate, without rubs, murmurs or gallops  Edema/varicosities:  Not grossly evident Abdominal  Soft,nontender, without masses, guarding or rebound.  Liver/spleen:  No organomegaly noted  Hernia:  None appreciated  Skin  Inspection:  Grossly normal  Palpation:  Grossly normal Neurologic/psychiatric  Orientation:  Normal with appropriate conversation.  Mood/affect:  Normal  Genitourinary    Breasts: Examined lying and sitting.     Right: Without masses, retractions, discharge or axillary adenopathy.     Left: Without masses, retractions, discharge or axillary adenopathy.   Inguinal/mons:  Normal without inguinal adenopathy  External genitalia:  Normal  BUS/Urethra/Skene's glands:  Normal  Bladder:  Normal  Vagina:  Normal  Cervix:  Normal  Uterus:  normal in size, shape and contour.  Midline and mobile  Adnexa/parametria:     Rt: Without masses or tenderness.   Lt: Without masses or tenderness.  Anus  and perineum: Normal  Digital rectal exam: Normal sphincter tone without palpated masses or tenderness  Assessment/Plan:  67 y.o. [name] G2P2 for breast and pelvic exam.  Osteopenia T score -1.9 Squamous skin  Cancer Hypercholesterolemia-primary care manages labs and meds  Plan: SBE's, continue annual mammogram, calcium rich diet, vitamin D 2000 daily. Home safety and fall prevention discussed. Repeat DEXA. Pap, Pap normal 2012, new screening guidelines reviewed.   Harrington Challenger WHNP, 12:30 PM 08/18/2013

## 2013-09-13 ENCOUNTER — Ambulatory Visit: Payer: Medicare Other

## 2013-10-17 ENCOUNTER — Ambulatory Visit
Admission: RE | Admit: 2013-10-17 | Discharge: 2013-10-17 | Disposition: A | Discharge status: Home / Self Care | Payer: Medicare PPO | Source: Ambulatory Visit

## 2013-10-17 DIAGNOSIS — Z1231 Encounter for screening mammogram for malignant neoplasm of breast: Secondary | ICD-10-CM

## 2014-07-30 ENCOUNTER — Encounter: Payer: Self-pay | Admitting: Women's Health

## 2014-08-29 ENCOUNTER — Ambulatory Visit (INDEPENDENT_AMBULATORY_CARE_PROVIDER_SITE_OTHER): Payer: Medicare PPO | Admitting: Women's Health

## 2014-08-29 ENCOUNTER — Encounter: Payer: Self-pay | Admitting: Women's Health

## 2014-08-29 VITALS — BP 132/74 | Ht 62.0 in | Wt 142.0 lb

## 2014-08-29 DIAGNOSIS — M8580 Other specified disorders of bone density and structure, unspecified site: Secondary | ICD-10-CM

## 2014-08-29 NOTE — Patient Instructions (Signed)

## 2014-08-29 NOTE — Progress Notes (Signed)
Jackie Jackson [birth year] [id number]    History:    Presents for breast and pelvic exam. Postmenopausal/no HRT/no bleeding. Normal Pap and mammogram history. Negative breast biopsy 2013. Has not had a colonoscopy declines. Osteopenia 2011 T score -1.8 at spine, -1.9 at right femoral FRAX 10%/1.4%. Current on vaccines. Primary care manages hypercholesterolemia.  Past medical history, past surgical history, family history and social history were all reviewed and documented in the EPIC chart. Sister lung cancer, husband diabetes, not sexually active.  ROS:  A  12 point ROS was performed and pertinent positives and negatives are included.  Exam:  Filed Vitals:   08/29/14 1025  BP: 132/74    General appearance:  Normal Thyroid:  Symmetrical, normal in size, without palpable masses or nodularity. Respiratory  Auscultation:  Clear without wheezing or rhonchi Cardiovascular  Auscultation:  Regular rate, without rubs, murmurs or gallops  Edema/varicosities:  Not grossly evident Abdominal  Soft,nontender, without masses, guarding or rebound.  Liver/spleen:  No organomegaly noted  Hernia:  None appreciated  Skin  Inspection:  Grossly normal   Breasts: Examined lying and sitting.     Right: Without masses, retractions, discharge or axillary adenopathy.     Left: Without masses, retractions, discharge or axillary adenopathy. Gentitourinary   Inguinal/mons:  Normal without inguinal adenopathy  External genitalia:  Normal  BUS/Urethra/Skene's glands:  Normal  Vagina:  Atrophic  Cervix:  Normal  Uterus:   normal in size, shape and contour.  Midline and mobile  Adnexa/parametria:     Rt: Without masses or tenderness.   Lt: Without masses or tenderness.  Anus and perineum: Normal  Digital rectal exam: Normal sphincter tone without palpated masses or tenderness  Assessment/Plan:  68 y.o.  for breast and pelvic exam.  Postmenopausal/no HRT/no bleeding Osteopenia without elevated  FRAX Hypercholesterolemia primary care manages Basal skin cancer history-annual skin checks with dermatologist  Plan: Reviewed importance of screening colonoscopy. SBE's, continue annual mammogram, 3-D tomography reviewed and encouraged history of dense breast. Continue regular 3-4 times weekly exercise, calcium rich diet, vitamin D 2000 daily encouraged. Home safety, fall prevention and importance of weightbearing exercises reviewed. Repeat DEXA, will schedule. Pap normal 2014, new screening guidelines reviewed.  [place], 11:00 AM 08/29/2014

## 2014-09-17 ENCOUNTER — Other Ambulatory Visit: Payer: Self-pay

## 2014-09-17 DIAGNOSIS — Z1231 Encounter for screening mammogram for malignant neoplasm of breast: Secondary | ICD-10-CM

## 2014-10-19 ENCOUNTER — Ambulatory Visit: Payer: Medicare PPO

## 2014-10-24 ENCOUNTER — Encounter (INDEPENDENT_AMBULATORY_CARE_PROVIDER_SITE_OTHER): Payer: Self-pay

## 2014-10-24 ENCOUNTER — Ambulatory Visit
Admission: RE | Admit: 2014-10-24 | Discharge: 2014-10-24 | Disposition: A | Discharge status: Home / Self Care | Payer: 59 | Source: Ambulatory Visit

## 2014-10-24 DIAGNOSIS — Z1231 Encounter for screening mammogram for malignant neoplasm of breast: Secondary | ICD-10-CM

## 2015-02-13 ENCOUNTER — Other Ambulatory Visit: Payer: Self-pay | Admitting: Physician Assistant

## 2015-06-26 ENCOUNTER — Other Ambulatory Visit: Payer: Self-pay | Admitting: Physician Assistant

## 2015-09-05 ENCOUNTER — Encounter: Payer: Self-pay | Admitting: Women's Health

## 2015-09-05 ENCOUNTER — Ambulatory Visit (INDEPENDENT_AMBULATORY_CARE_PROVIDER_SITE_OTHER): Payer: Medicare Other | Admitting: Women's Health

## 2015-09-05 VITALS — BP 126/80 | Ht 62.0 in | Wt 146.0 lb

## 2015-09-05 DIAGNOSIS — M899 Disorder of bone, unspecified: Secondary | ICD-10-CM | POA: Diagnosis not present

## 2015-09-05 DIAGNOSIS — N952 Postmenopausal atrophic vaginitis: Secondary | ICD-10-CM

## 2015-09-05 DIAGNOSIS — Z13820 Encounter for screening for osteoporosis: Secondary | ICD-10-CM

## 2015-09-05 DIAGNOSIS — Z01419 Encounter for gynecological examination (general) (routine) without abnormal findings: Secondary | ICD-10-CM

## 2015-09-05 DIAGNOSIS — M8580 Other specified disorders of bone density and structure, unspecified site: Secondary | ICD-10-CM

## 2015-09-05 NOTE — Patient Instructions (Signed)
Menopause is a normal process in which your reproductive ability comes to an end. This process happens gradually over a span of months to years, usually between the ages of 48 and 55. Menopause is complete when you have missed 12 consecutive menstrual periods. It is important to talk with your health care provider about some of the most common conditions that affect postmenopausal women, such as heart disease, cancer, and bone loss (osteoporosis). Adopting a healthy lifestyle and getting preventive care can help to promote your health and wellness. Those actions can also lower your chances of developing some of these common conditions. WHAT SHOULD I KNOW ABOUT MENOPAUSE? During menopause, you may experience a number of symptoms, such as:  Moderate-to-severe hot flashes.  Night sweats.  Decrease in sex drive.  Mood swings.  Headaches.  Tiredness.  Irritability.  Memory problems.  Insomnia. Choosing to treat or not to treat menopausal changes is an individual decision that you make with your health care provider. WHAT SHOULD I KNOW ABOUT HORMONE REPLACEMENT THERAPY AND SUPPLEMENTS? Hormone therapy products are effective for treating symptoms that are associated with menopause, such as hot flashes and night sweats. Hormone replacement carries certain risks, especially as you become older. If you are thinking about using estrogen or estrogen with progestin treatments, discuss the benefits and risks with your health care provider. WHAT SHOULD I KNOW ABOUT HEART DISEASE AND STROKE? Heart disease, heart attack, and stroke become more likely as you age. This may be due, in part, to the hormonal changes that your body experiences during menopause. These can affect how your body processes dietary fats, triglycerides, and cholesterol. Heart attack and stroke are both medical emergencies. There are many things that you can do to help prevent heart disease and stroke:  Have your blood pressure  checked at least every 1-2 years. High blood pressure causes heart disease and increases the risk of stroke.  If you are 55-79 years old, ask your health care provider if you should take aspirin to prevent a heart attack or a stroke.  Do not use any tobacco products, including cigarettes, chewing tobacco, or electronic cigarettes. If you need help quitting, ask your health care provider.  It is important to eat a healthy diet and maintain a healthy weight.  Be sure to include plenty of vegetables, fruits, low-fat dairy products, and lean protein.  Avoid eating foods that are high in solid fats, added sugars, or salt (sodium).  Get regular exercise. This is one of the most important things that you can do for your health.  Try to exercise for at least 150 minutes each week. The type of exercise that you do should increase your heart rate and make you sweat. This is known as moderate-intensity exercise.  Try to do strengthening exercises at least twice each week. Do these in addition to the moderate-intensity exercise.  Know your numbers.Ask your health care provider to check your cholesterol and your blood glucose. Continue to have your blood tested as directed by your health care provider. WHAT SHOULD I KNOW ABOUT CANCER SCREENING? There are several types of cancer. Take the following steps to reduce your risk and to catch any cancer development as early as possible. Breast Cancer  Practice breast self-awareness.  This means understanding how your breasts normally appear and feel.  It also means doing regular breast self-exams. Let your health care provider know about any changes, no matter how small.  If you are 40 or older, have a clinician do a   breast exam (clinical breast exam or CBE) every year. Depending on your age, family history, and medical history, it may be recommended that you also have a yearly breast X-ray (mammogram).  If you have a family history of breast cancer,  talk with your health care provider about genetic screening.  If you are at high risk for breast cancer, talk with your health care provider about having an MRI and a mammogram every year.  Breast cancer (BRCA) gene test is recommended for women who have family members with BRCA-related cancers. Results of the assessment will determine the need for genetic counseling and BRCA1 and for BRCA2 testing. BRCA-related cancers include these types:  Breast. This occurs in males or females.  Ovarian.  Tubal. This may also be called fallopian tube cancer.  Cancer of the abdominal or pelvic lining (peritoneal cancer).  Prostate.  Pancreatic. Cervical, Uterine, and Ovarian Cancer Your health care provider may recommend that you be screened regularly for cancer of the pelvic organs. These include your ovaries, uterus, and vagina. This screening involves a pelvic exam, which includes checking for microscopic changes to the surface of your cervix (Pap test).  For women ages 21-65, health care providers may recommend a pelvic exam and a Pap test every three years. For women ages 77-65, they may recommend the Pap test and pelvic exam, combined with testing for human papilloma virus (HPV), every five years. Some types of HPV increase your risk of cervical cancer. Testing for HPV may also be done on women of any age who have unclear Pap test results.  Other health care providers may not recommend any screening for nonpregnant women who are considered low risk for pelvic cancer and have no symptoms. Ask your health care provider if a screening pelvic exam is right for you.  If you have had past treatment for cervical cancer or a condition that could lead to cancer, you need Pap tests and screening for cancer for at least 20 years after your treatment. If Pap tests have been discontinued for you, your risk factors (such as having a new sexual partner) need to be reassessed to determine if you should start having  screenings again. Some women have medical problems that increase the chance of getting cervical cancer. In these cases, your health care provider may recommend that you have screening and Pap tests more often.  If you have a family history of uterine cancer or ovarian cancer, talk with your health care provider about genetic screening.  If you have vaginal bleeding after reaching menopause, tell your health care provider.  There are currently no reliable tests available to screen for ovarian cancer. Lung Cancer Lung cancer screening is recommended for adults 3-70 years old who are at high risk for lung cancer because of a history of smoking. A yearly low-dose CT scan of the lungs is recommended if you:  Currently smoke.  Have a history of at least 30 pack-years of smoking and you currently smoke or have quit within the past 15 years. A pack-year is smoking an average of one pack of cigarettes per day for one year. Yearly screening should:  Continue until it has been 15 years since you quit.  Stop if you develop a health problem that would prevent you from having lung cancer treatment. Colorectal Cancer  This type of cancer can be detected and can often be prevented.  Routine colorectal cancer screening usually begins at age 38 and continues through age 12.  If you have  risk factors for colon cancer, your health care provider may recommend that you be screened at an earlier age.  If you have a family history of colorectal cancer, talk with your health care provider about genetic screening.  Your health care provider may also recommend using home test kits to check for hidden blood in your stool.  A small camera at the end of a tube can be used to examine your colon directly (sigmoidoscopy or colonoscopy). This is done to check for the earliest forms of colorectal cancer.  Direct examination of the colon should be repeated every 5-10 years until age 67. However, if early forms of  precancerous polyps or small growths are found or if you have a family history or genetic risk for colorectal cancer, you may need to be screened more often. Skin Cancer  Check your skin from head to toe regularly.  Monitor any moles. Be sure to tell your health care provider:  About any new moles or changes in moles, especially if there is a change in a mole's shape or color.  If you have a mole that is larger than the size of a pencil eraser.  If any of your family members has a history of skin cancer, especially at a young age, talk with your health care provider about genetic screening.  Always use sunscreen. Apply sunscreen liberally and repeatedly throughout the day.  Whenever you are outside, protect yourself by wearing long sleeves, pants, a wide-brimmed hat, and sunglasses. WHAT SHOULD I KNOW ABOUT OSTEOPOROSIS? Osteoporosis is a condition in which bone destruction happens more quickly than new bone creation. After menopause, you may be at an increased risk for osteoporosis. To help prevent osteoporosis or the bone fractures that can happen because of osteoporosis, the following is recommended:  If you are 39-61 years old, get at least 1,000 mg of calcium and at least 600 mg of vitamin D per day.  If you are older than age 16 but younger than age 7, get at least 1,200 mg of calcium and at least 600 mg of vitamin D per day.  If you are older than age 47, get at least 1,200 mg of calcium and at least 800 mg of vitamin D per day. Smoking and excessive alcohol intake increase the risk of osteoporosis. Eat foods that are rich in calcium and vitamin D, and do weight-bearing exercises several times each week as directed by your health care provider. WHAT SHOULD I KNOW ABOUT HOW MENOPAUSE AFFECTS [place]? Depression may occur at any age, but it is more common as you become older. Common symptoms of depression include:  Low or sad mood.  Changes in sleep patterns.  Changes  in appetite or eating patterns.  Feeling an overall lack of motivation or enjoyment of activities that you previously enjoyed.  Frequent crying spells. Talk with your health care provider if you think that you are experiencing depression. WHAT SHOULD I KNOW ABOUT IMMUNIZATIONS? It is important that you get and maintain your immunizations. These include:  Tetanus, diphtheria, and pertussis (Tdap) booster vaccine.  Influenza every year before the flu season begins.  Pneumonia vaccine.  Shingles vaccine. Your health care provider may also recommend other immunizations.   This information is not intended to replace advice given to you by your health care provider. Make sure you discuss any questions you have with your health care provider.   Document Released: 11/06/2005 Document Revised: 10/05/2014 Document Reviewed: 05/17/2014 Elsevier Interactive Patient Education 2016 [employer] Diabetes Mellitus and Food It is important for you to manage your blood sugar (glucose) level. Your blood glucose level can be greatly affected by what you eat. Eating healthier foods in the appropriate amounts throughout the day at about the same time each day will help you control your blood glucose level. It can also help slow or prevent worsening of your diabetes mellitus. Healthy eating may even help you improve the level of your blood pressure and reach or maintain a healthy weight.  General recommendations for healthful eating and cooking habits include:  Eating meals and snacks regularly. Avoid going long periods of time without eating to lose weight.  Eating a diet that consists mainly of plant-based foods, such as fruits, vegetables, nuts, legumes, and whole grains.  Using low-heat cooking methods, such as baking, instead of high-heat cooking methods, such as deep frying. Work with your dietitian to make sure you understand how to use the Nutrition Facts information on food labels. HOW CAN FOOD  AFFECT ME? Carbohydrates Carbohydrates affect your blood glucose level more than any other type of food. Your dietitian will help you determine how many carbohydrates to eat at each meal and teach you how to count carbohydrates. Counting carbohydrates is important to keep your blood glucose at a healthy level, especially if you are using insulin or taking certain medicines for diabetes mellitus. Alcohol Alcohol can cause sudden decreases in blood glucose (hypoglycemia), especially if you use insulin or take certain medicines for diabetes mellitus. Hypoglycemia can be a life-threatening condition. Symptoms of hypoglycemia (sleepiness, dizziness, and disorientation) are similar to symptoms of having too much alcohol.  If your health care provider has given you approval to drink alcohol, do so in moderation and use the following guidelines:  Women should not have more than one drink per day, and men should not have more than two drinks per day. One drink is equal to:  12 oz of beer.  5 oz of wine.  1 oz of hard liquor.  Do not drink on an empty stomach.  Keep yourself hydrated. Have water, diet soda, or unsweetened iced tea.  Regular soda, juice, and other mixers might contain a lot of carbohydrates and should be counted. WHAT FOODS ARE NOT RECOMMENDED? As you make food choices, it is important to remember that all foods are not the same. Some foods have fewer nutrients per serving than other foods, even though they might have the same number of calories or carbohydrates. It is difficult to get your body what it needs when you eat foods with fewer nutrients. Examples of foods that you should avoid that are high in calories and carbohydrates but low in nutrients include:  Trans fats (most processed foods list trans fats on the Nutrition Facts label).  Regular soda.  Juice.  Candy.  Sweets, such as cake, pie, doughnuts, and cookies.  Fried foods. WHAT FOODS CAN I EAT? Eat nutrient-rich  foods, which will nourish your body and keep you healthy. The food you should eat also will depend on several factors, including:  The calories you need.  The medicines you take.  Your weight.  Your blood glucose level.  Your blood pressure level.  Your cholesterol level. You should eat a variety of foods, including:  Protein.  Lean cuts of meat.  Proteins low in saturated fats, such as fish, egg whites, and beans. Avoid processed meats.  Fruits and vegetables.  Fruits and vegetables that may help control blood glucose levels, such as apples,   mangoes, and yams.  Dairy products.  Choose fat-free or low-fat dairy products, such as milk, yogurt, and cheese.  Grains, bread, pasta, and rice.  Choose whole grain products, such as multigrain bread, whole oats, and brown rice. These foods may help control blood pressure.  Fats.  Foods containing healthful fats, such as nuts, avocado, olive oil, canola oil, and fish. DOES EVERYONE WITH DIABETES MELLITUS HAVE THE SAME MEAL PLAN? Because every person with diabetes mellitus is different, there is not one meal plan that works for everyone. It is very important that you meet with a dietitian who will help you create a meal plan that is just right for you.   This information is not intended to replace advice given to you by your health care provider. Make sure you discuss any questions you have with your health care provider.   Document Released: 06/11/2005 Document Revised: 10/05/2014 Document Reviewed: 08/11/2013 Elsevier Interactive Patient Education 2016 [employer] Inc[employer]  

## 2015-09-05 NOTE — Progress Notes (Signed)
Jackie Jackson 1946-08-17 [id number]    History:    Presents for breast and pelvic exam. Postmenopausal on no HRT with no bleeding. Not sexually active/husbands health. Osteopenia T score -1.9 at spine FRAX 10%/1.4%. Has never had a colonoscopy declines. Normal Pap and mammogram history. Current on vaccines. Hypercholesterolemia primary care manages labs and meds. Skin cancer annual dermatology appointments.  Past medical history, past surgical history, family history and social history were all reviewed and documented in the EPIC chart. Retired, 2 children both doing well. Mother lives independently age 29. Husband diabetes, sister lung cancer survivor.  ROS:  A ROS was performed and pertinent positives and negatives are included.  Exam:  Filed Vitals:   09/05/15 1109  BP: 126/80    General appearance:  Normal Thyroid:  Symmetrical, normal in size, without palpable masses or nodularity. Respiratory  Auscultation:  Clear without wheezing or rhonchi Cardiovascular  Auscultation:  Regular rate, without rubs, murmurs or gallops  Edema/varicosities:  Not grossly evident Abdominal  Soft,nontender, without masses, guarding or rebound.  Liver/spleen:  No organomegaly noted  Hernia:  None appreciated  Skin  Inspection:  Grossly normal   Breasts: Examined lying and sitting.     Right: Without masses, retractions, discharge or axillary adenopathy.     Left: Without masses, retractions, discharge or axillary adenopathy. Gentitourinary   Inguinal/mons:  Normal without inguinal adenopathy  External genitalia:  Normal  BUS/Urethra/Skene's glands:  Normal  Vagina: Atrophic  Cervix:  Normal  Uterus:  normal in size, shape and contour.  Midline and mobile  Adnexa/parametria:     Rt: Without masses or tenderness.   Lt: Without masses or tenderness.  Anus and perineum: Normal  Digital rectal exam: Normal sphincter tone without palpated masses or tenderness  Assessment/Plan:  69 y.o. [name] G2  P2 for breast and pelvic exam with no complaints.  Postmenopausal/no HRT/no bleeding/not sexually active/vaginal atrophy Osteopenia without elevated FRAX Skin cancer annual skin checks dermatologist Hypercholesterolemia primary care manages labs and meds  Plan: Reviewed importance of screening colonoscopy declines. Repeat DEXA, instructed to schedule, home safety, fall prevention and importance of regular weightbearing exercise reviewed. Vitamin D 1000 daily encouraged. SBE's, continue annual 3-D screening mammogram. UA,  Huel Cote [facility], 11:36 AM 09/05/2015

## 2015-10-01 ENCOUNTER — Other Ambulatory Visit: Payer: Self-pay

## 2015-10-01 DIAGNOSIS — Z1231 Encounter for screening mammogram for malignant neoplasm of breast: Secondary | ICD-10-CM

## 2015-10-16 ENCOUNTER — Encounter: Payer: Self-pay | Admitting: Physician Assistant

## 2015-10-29 ENCOUNTER — Ambulatory Visit
Admission: RE | Admit: 2015-10-29 | Discharge: 2015-10-29 | Disposition: A | Discharge status: Home / Self Care | Payer: Medicare Other | Source: Ambulatory Visit

## 2015-10-29 DIAGNOSIS — Z1231 Encounter for screening mammogram for malignant neoplasm of breast: Secondary | ICD-10-CM

## 2016-06-03 ENCOUNTER — Other Ambulatory Visit: Payer: Self-pay | Admitting: Physician Assistant

## 2016-08-26 ENCOUNTER — Encounter: Payer: Self-pay | Admitting: Physician Assistant

## 2016-08-26 ENCOUNTER — Ambulatory Visit (INDEPENDENT_AMBULATORY_CARE_PROVIDER_SITE_OTHER): Payer: Medicare Other | Admitting: Physician Assistant

## 2016-08-26 VITALS — BP 138/79 | HR 81 | Temp 96.7°F | Ht 61.75 in | Wt 145.8 lb

## 2016-08-26 DIAGNOSIS — Z0000 Encounter for general adult medical examination without abnormal findings: Secondary | ICD-10-CM | POA: Diagnosis not present

## 2016-08-26 DIAGNOSIS — E7800 Pure hypercholesterolemia, unspecified: Secondary | ICD-10-CM

## 2016-08-26 DIAGNOSIS — J301 Allergic rhinitis due to pollen: Secondary | ICD-10-CM

## 2016-08-26 DIAGNOSIS — Z1211 Encounter for screening for malignant neoplasm of colon: Secondary | ICD-10-CM

## 2016-08-26 DIAGNOSIS — Z23 Encounter for immunization: Secondary | ICD-10-CM | POA: Diagnosis not present

## 2016-08-26 DIAGNOSIS — K219 Gastro-esophageal reflux disease without esophagitis: Secondary | ICD-10-CM

## 2016-08-26 MED ORDER — METOCLOPRAMIDE HCL 10 MG PO TABS: 10.0000 mg | ORAL_TABLET | Freq: Three times a day (TID) | ORAL | 6 refills | Status: DC

## 2016-08-26 MED ORDER — LORATADINE 10 MG PO TABS: 10.0000 mg | ORAL_TABLET | Freq: Every day | ORAL | 11 refills | Status: DC | PRN

## 2016-08-26 MED ORDER — ATORVASTATIN CALCIUM 10 MG PO TABS: 10.0000 mg | ORAL_TABLET | Freq: Every day | ORAL | 11 refills | Status: DC

## 2016-08-26 MED ORDER — DICYCLOMINE HCL 10 MG PO CAPS: 10.0000 mg | ORAL_CAPSULE | Freq: Three times a day (TID) | ORAL | 6 refills | Status: DC

## 2016-08-26 MED ORDER — RANITIDINE HCL 150 MG PO TABS: 150.0000 mg | ORAL_TABLET | Freq: Every day | ORAL | 11 refills | Status: DC

## 2016-08-26 NOTE — Patient Instructions (Signed)
Hiatal Hernia A hiatal hernia occurs when part of your stomach slides above the muscle that separates your abdomen from your chest (diaphragm). You can be born with a hiatal hernia (congenital), or it may develop over time. In almost all cases of hiatal hernia, only the top part of the stomach pushes through.  Many people have a hiatal hernia with no symptoms. The larger the hernia, the more likely that you will have symptoms. In some cases, a hiatal hernia allows stomach acid to flow back into the tube that carries food from your mouth to your stomach (esophagus). This may cause heartburn symptoms. Severe heartburn symptoms may mean you have developed a condition called gastroesophageal reflux disease (GERD).  CAUSES  Hiatal hernias are caused by a weakness in the opening (hiatus) where your esophagus passes through your diaphragm to attach to the upper part of your stomach. You may be born with a weakness in your hiatus, or a weakness can develop. RISK FACTORS Older age is a major risk factor for a hiatal hernia. Anything that increases pressure on your diaphragm can also increase your risk of a hiatal hernia. This includes:  Pregnancy.  Excess weight.  Frequent constipation. SIGNS AND SYMPTOMS  People with a hiatal hernia often have no symptoms. If symptoms develop, they are almost always caused by GERD. They may include:  Heartburn.  Belching.  Indigestion.  Trouble swallowing.  Coughing or wheezing.  Sore throat.  Hoarseness.  Chest pain. DIAGNOSIS  A hiatal hernia is sometimes found during an exam for another problem. Your health care provider may suspect a hiatal hernia if you have symptoms of GERD. Tests may be done to diagnose GERD. These may include:  X-rays of your stomach or chest.  An upper gastrointestinal (GI) series. This is an X-ray exam of your GI tract involving the use of a chalky liquid that you swallow. The liquid shows up clearly on the X-ray.  Endoscopy.  This is a procedure to look into your stomach using a thin, flexible tube that has a tiny camera and light on the end of it. TREATMENT  If you have no symptoms, you may not need treatment. If you have symptoms, treatment may include:  Dietary and lifestyle changes to help reduce GERD symptoms.  Medicines. These may include:  Over-the-counter antacids.  Medicines that make your stomach empty more quickly.  Medicines that block the production of stomach acid (H2 blockers).  Stronger medicines to reduce stomach acid (proton pump inhibitors).  You may need surgery to repair the hernia if other treatments are not helping. HOME CARE INSTRUCTIONS   Take all medicines as directed by your health care provider.  Quit smoking, if you smoke.  Try to achieve and maintain a healthy body weight.  Eat frequent small meals instead of three large meals a day. This keeps your stomach from getting too full.  Eat slowly.  Do not lie down right after eating.  Do noteat 1-2 hours before bed.   Do not drink beverages with caffeine. These include cola, coffee, cocoa, and tea.  Do not drink alcohol.  Avoid foods that can make symptoms of GERD worse. These may include:  Fatty foods.  Citrus fruits.  Other foods and drinks that contain acid.  Avoid putting pressure on your belly. Anything that puts pressure on your belly increases the amount of acid that may be pushed up into your esophagus.   Avoid bending over, especially after eating.  Raise the head of your bed   by putting blocks under the legs. This keeps your head and esophagus higher than your stomach.  Do not wear tight clothing around your chest or stomach.  Try not to strain when having a bowel movement, when urinating, or when lifting heavy objects. SEEK MEDICAL CARE IF:  Your symptoms are not controlled with medicines or lifestyle changes.  You are having trouble swallowing.  You have coughing or wheezing that will not  go away. SEEK IMMEDIATE MEDICAL CARE IF:  Your pain is getting worse.  Your pain spreads to your arms, neck, jaw, teeth, or back.  You have shortness of breath.  You sweat for no reason.  You feel sick to your stomach (nauseous) or vomit.  You vomit blood.  You have bright red blood in your stools.  You have black, tarry stools.  This information is not intended to replace advice given to you by your health care provider. Make sure you discuss any questions you have with your health care provider. Document Released: 12/05/2003 Document Revised: 01/06/2016 Document Reviewed: 09/01/2013 Elsevier Interactive Patient Education  2017 [employer]vier Inc.  

## 2016-08-27 LAB — CBC WITH DIFFERENTIAL/PLATELET
BASOS ABS: 0 10*3/uL (ref 0.0–0.2)
BASOS: 1 %
EOS (ABSOLUTE): 0 10*3/uL (ref 0.0–0.4)
Eos: 1 %
HEMOGLOBIN: 13.1 g/dL (ref 11.1–15.9)
Hematocrit: 39.5 % (ref 34.0–46.6)
IMMATURE GRANS (ABS): 0 10*3/uL (ref 0.0–0.1)
IMMATURE GRANULOCYTES: 0 %
LYMPHS: 36 %
Lymphocytes Absolute: 1.8 10*3/uL (ref 0.7–3.1)
MCH: 29.2 pg (ref 26.6–33.0)
MCHC: 33.2 g/dL (ref 31.5–35.7)
MCV: 88 fL (ref 79–97)
MONOCYTES: 8 %
Monocytes Absolute: 0.4 10*3/uL (ref 0.1–0.9)
NEUTROS ABS: 2.9 10*3/uL (ref 1.4–7.0)
NEUTROS PCT: 54 %
PLATELETS: 277 10*3/uL (ref 150–379)
RBC: 4.49 x10E6/uL (ref 3.77–5.28)
RDW: 13.9 % (ref 12.3–15.4)
WBC: 5.2 10*3/uL (ref 3.4–10.8)

## 2016-08-27 LAB — CMP14+EGFR
ALBUMIN: 4.6 g/dL (ref 3.5–4.8)
ALK PHOS: 87 IU/L (ref 39–117)
ALT: 16 IU/L (ref 0–32)
AST: 17 IU/L (ref 0–40)
Albumin/Globulin Ratio: 2.2 (ref 1.2–2.2)
BUN / CREAT RATIO: 15 (ref 12–28)
BUN: 14 mg/dL (ref 8–27)
Bilirubin Total: 0.3 mg/dL (ref 0.0–1.2)
CHLORIDE: 100 mmol/L (ref 96–106)
CO2: 24 mmol/L (ref 18–29)
CREATININE: 0.92 mg/dL (ref 0.57–1.00)
Calcium: 9.7 mg/dL (ref 8.7–10.3)
GFR calc Af Amer: 73 mL/min/{1.73_m2} (ref >59)
GFR calc non Af Amer: 63 mL/min/{1.73_m2} (ref >59)
GLUCOSE: 82 mg/dL (ref 65–99)
Globulin, Total: 2.1 g/dL (ref 1.5–4.5)
Potassium: 4.4 mmol/L (ref 3.5–5.2)
Sodium: 141 mmol/L (ref 134–144)
Total Protein: 6.7 g/dL (ref 6.0–8.5)

## 2016-08-27 LAB — LIPID PANEL
CHOLESTEROL TOTAL: 202 mg/dL — AB (ref 100–199)
Chol/HDL Ratio: 3.2 ratio units (ref 0.0–4.4)
HDL: 64 mg/dL (ref >39)
LDL CALC: 121 mg/dL — AB (ref 0–99)
TRIGLYCERIDES: 84 mg/dL (ref 0–149)
VLDL CHOLESTEROL CAL: 17 mg/dL (ref 5–40)

## 2016-08-28 NOTE — Progress Notes (Signed)
BP 138/79   Pulse 81   Temp (!) 96.7 F (35.9 C) (Oral)   Ht 5' 1.75" (1.568 m)   Wt 145 lb 12.8 oz (66.1 kg)   LMP 08/04/1997   BMI 26.88 kg/m    Subjective:    Patient ID: Jackie Jackson, female    DOB: 12/12/1945, 70 y.o.   MRN: [id number]  Jackie Jackson is a 70 y.o. female presenting on 08/26/2016 for Annual Exam (and labs )  HPI Patient here to be established as new patient at [place].  This patient is known to me from [facility]. This patient comes in for annual check and recheck on medications and conditions. All medications are reviewed today. There are no reports of any problems with the medications. All of the medical conditions are reviewed and updated.  Lab work is reviewed and will be ordered as medically necessary.   We discussed needing a colonoscopy. She states that her insurance has paid for cologuard test and she would like to have that done. We will place an order for this.   Reports having more issues of GERD and symptoms when she lays down at night. She is able to turn over onto her left side and have some relief of the epigastric pressure. She also has noticed a difference in her voice as far as hoarseness. She is not a smoker. She has a very distant brief use of tobacco. She had a sensitivity to pantoprazole of swelling.  Currently Zantac 150 mg daily has most GERD controlled.  Past Medical History:  Diagnosis Date  . Acid reflux   . High cholesterol   . Osteopenia    2006 -1.4, 2009 -1.9, 2011 -1.9   Relevant past medical, surgical, family and social history reviewed and updated as indicated. Interim medical history since our last visit reviewed. Allergies and medications reviewed and updated.   Data reviewed from any sources in EPIC.  Review of Systems  Constitutional: Negative.  Negative for activity change, fatigue and fever.  HENT: Negative.   Eyes: Negative.   Respiratory: Negative.  Negative for cough.     Cardiovascular: Negative.  Negative for chest pain.  Gastrointestinal: Negative for abdominal distention, abdominal pain, blood in stool and vomiting.  Endocrine: Negative.   Genitourinary: Negative.  Negative for dysuria.  Musculoskeletal: Negative.   Skin: Negative.   Neurological: Negative.      Social History   Social History  . Marital status: Married    Spouse name: N/A  . Number of children: N/A  . Years of education: N/A   Occupational History  . Not on file.   Social History Main Topics  . Smoking status: Never Smoker  . Smokeless tobacco: Never Used  . Alcohol use No  . Drug use: No  . Sexual activity: No   Other Topics Concern  . Not on file   Social History Narrative  . No narrative on file    Past Surgical History:  Procedure Laterality Date  . SKIN CANCER DESTRUCTION  2012   rt. lower leg  . TUBAL LIGATION      Family History  Problem Relation Age of Onset  . Diabetes Brother   . Heart disease Father   . Heart disease Mother       Medication List       Accurate as of 08/26/16 11:59 PM. Always use your most recent med list.  acetaminophen 500 MG tablet Commonly known as:  TYLENOL Take 500 mg by mouth every 6 (six) hours as needed for pain (FOR HEADACHE PAIN).   atorvastatin 10 MG tablet Commonly known as:  LIPITOR Take 1 tablet (10 mg total) by mouth daily.   calcium carbonate 600 MG Tabs tablet Commonly known as:  OS-CAL Take 600 mg by mouth 2 (two) times daily.   cholecalciferol 1000 units tablet Commonly known as:  VITAMIN D Take 1,000 Units by mouth daily.   dicyclomine 10 MG capsule Commonly known as:  BENTYL Take 1 capsule (10 mg total) by mouth 4 (four) times daily -  before meals and at bedtime.   fish oil-omega-3 fatty acids 1000 MG capsule Take 2 g by mouth daily.   ibuprofen 200 MG tablet Commonly known as:  ADVIL,MOTRIN Take 200 mg by mouth every 6 (six) hours as needed for pain.   loratadine 10 MG  tablet Commonly known as:  CLARITIN Take 1 tablet (10 mg total) by mouth daily as needed for allergies.   metoCLOPramide 10 MG tablet Commonly known as:  REGLAN Take 1 tablet (10 mg total) by mouth 4 (four) times daily -  before meals and at bedtime.   ranitidine 150 MG tablet Commonly known as:  ZANTAC Take 1-2 tablets (150-300 mg total) by mouth daily.   vitamin B-12 500 MCG tablet Commonly known as:  CYANOCOBALAMIN Take 500 mcg by mouth daily.          Objective:    BP 138/79   Pulse 81   Temp (!) 96.7 F (35.9 C) (Oral)   Ht 5' 1.75" (1.568 m)   Wt 145 lb 12.8 oz (66.1 kg)   LMP 08/04/1997   BMI 26.88 kg/m   Allergies  Allergen Reactions  . Pantoprazole Sodium Swelling    Protonix   Wt Readings from Last 3 Encounters:  08/26/16 145 lb 12.8 oz (66.1 kg)  09/05/15 146 lb (66.2 kg)  08/29/14 142 lb (64.4 kg)    Physical Exam  Constitutional: She is oriented to person, place, and time. She appears well-developed and well-nourished.  HENT:  Head: Normocephalic and atraumatic.  Eyes: Conjunctivae and EOM are normal. Pupils are equal, round, and reactive to light.  Neck: Normal range of motion. Neck supple.  Cardiovascular: Normal rate, regular rhythm, normal heart sounds and intact distal pulses.   Pulmonary/Chest: Effort normal and breath sounds normal.  Abdominal: Soft. Bowel sounds are normal.  Neurological: She is alert and oriented to person, place, and time. She has normal reflexes.  Skin: Skin is warm and dry. No rash noted.  Psychiatric: She has a normal mood and affect. Her behavior is normal. Judgment and thought content normal.  Nursing note and vitals reviewed.   Results for orders placed or performed in visit on 08/26/16  CBC with Differential/Platelet  Result Value Ref Range   WBC 5.2 3.4 - 10.8 x10E3/uL   RBC 4.49 3.77 - 5.28 x10E6/uL   Hemoglobin 13.1 11.1 - 15.9 g/dL   Hematocrit 39.5 34.0 - 46.6 %   MCV 88 79 - 97 fL   MCH 29.2 26.6 -  33.0 pg   MCHC 33.2 31.5 - 35.7 g/dL   RDW 13.9 12.3 - 15.4 %   Platelets 277 150 - 379 x10E3/uL   Neutrophils 54 Not Estab. %   Lymphs 36 Not Estab. %   Monocytes 8 Not Estab. %   Eos 1 Not Estab. %   Basos 1 Not Estab. %  Neutrophils Absolute 2.9 1.4 - 7.0 x10E3/uL   Lymphocytes Absolute 1.8 0.7 - 3.1 x10E3/uL   Monocytes Absolute 0.4 0.1 - 0.9 x10E3/uL   EOS (ABSOLUTE) 0.0 0.0 - 0.4 x10E3/uL   Basophils Absolute 0.0 0.0 - 0.2 x10E3/uL   Immature Granulocytes 0 Not Estab. %   Immature Grans (Abs) 0.0 0.0 - 0.1 x10E3/uL  CMP14+EGFR  Result Value Ref Range   Glucose 82 65 - 99 mg/dL   BUN 14 8 - 27 mg/dL   Creatinine, Ser 0.92 0.57 - 1.00 mg/dL   GFR calc non Af Amer 63 >59 mL/min/1.73   GFR calc Af Amer 73 >59 mL/min/1.73   BUN/Creatinine Ratio 15 12 - 28   Sodium 141 134 - 144 mmol/L   Potassium 4.4 3.5 - 5.2 mmol/L   Chloride 100 96 - 106 mmol/L   CO2 24 18 - 29 mmol/L   Calcium 9.7 8.7 - 10.3 mg/dL   Total Protein 6.7 6.0 - 8.5 g/dL   Albumin 4.6 3.5 - 4.8 g/dL   Globulin, Total 2.1 1.5 - 4.5 g/dL   Albumin/Globulin Ratio 2.2 1.2 - 2.2   Bilirubin Total 0.3 0.0 - 1.2 mg/dL   Alkaline Phosphatase 87 39 - 117 IU/L   AST 17 0 - 40 IU/L   ALT 16 0 - 32 IU/L  Lipid panel  Result Value Ref Range   Cholesterol, Total 202 (H) 100 - 199 mg/dL   Triglycerides 84 0 - 149 mg/dL   HDL 64 >39 mg/dL   VLDL Cholesterol Cal 17 5 - 40 mg/dL   LDL Calculated 121 (H) 0 - 99 mg/dL   Chol/HDL Ratio 3.2 0.0 - 4.4 ratio units      Assessment & Plan:   1. Gastroesophageal reflux disease without esophagitis - ranitidine (ZANTAC) 150 MG tablet; Take 1-2 tablets (150-300 mg total) by mouth daily.  Dispense: 60 tablet; Refill: 11 - metoCLOPramide (REGLAN) 10 MG tablet; Take 1 tablet (10 mg total) by mouth 4 (four) times daily -  before meals and at bedtime.  Dispense: 120 tablet; Refill: 6 - dicyclomine (BENTYL) 10 MG capsule; Take 1 capsule (10 mg total) by mouth 4 (four) times daily -   before meals and at bedtime.  Dispense: 120 capsule; Refill: 6 - CBC with Differential/Platelet - Lipid panel  2. Pure hypercholesterolemia - atorvastatin (LIPITOR) 10 MG tablet; Take 1 tablet (10 mg total) by mouth daily.  Dispense: 30 tablet; Refill: 11 - CBC with Differential/Platelet - CMP14+EGFR - Lipid panel  3. Chronic seasonal allergic rhinitis due to pollen  - loratadine (CLARITIN) 10 MG tablet; Take 1 tablet (10 mg total) by mouth daily as needed for allergies.  Dispense: 30 tablet; Refill: 11  4. Encounter for immunization - ranitidine (ZANTAC) 150 MG tablet; Take 1-2 tablets (150-300 mg total) by mouth daily.  Dispense: 60 tablet; Refill: 11 - loratadine (CLARITIN) 10 MG tablet; Take 1 tablet (10 mg total) by mouth daily as needed for allergies.  Dispense: 30 tablet; Refill: 11 - atorvastatin (LIPITOR) 10 MG tablet; Take 1 tablet (10 mg total) by mouth daily.  Dispense: 30 tablet; Refill: 11 - metoCLOPramide (REGLAN) 10 MG tablet; Take 1 tablet (10 mg total) by mouth 4 (four) times daily -  before meals and at bedtime.  Dispense: 120 tablet; Refill: 6 - dicyclomine (BENTYL) 10 MG capsule; Take 1 capsule (10 mg total) by mouth 4 (four) times daily -  before meals and at bedtime.  Dispense: 120 capsule; Refill: 6 -  CBC with Differential/Platelet - CMP14+EGFR - Lipid panel - Flu vaccine HIGH DOSE PF  5. Special screening for malignant neoplasms, colon Completed order for cologuard and will send for review if her insurance will cover this.   Continue all other maintenance medications as listed above. Educational handout given for hiatal hernia  Follow up plan: Return in about 6 months (around 02/23/2017).  Terald Sleeper PA-C [facility] [address]  [place], [place] [address] [phone]   08/28/2016, 1:43 PM

## 2016-09-08 ENCOUNTER — Ambulatory Visit (INDEPENDENT_AMBULATORY_CARE_PROVIDER_SITE_OTHER): Payer: Medicare Other | Admitting: Women's Health

## 2016-09-08 ENCOUNTER — Encounter: Payer: Self-pay | Admitting: Women's Health

## 2016-09-08 VITALS — BP 138/70 | Ht 62.0 in | Wt 145.0 lb

## 2016-09-08 DIAGNOSIS — Z01419 Encounter for gynecological examination (general) (routine) without abnormal findings: Secondary | ICD-10-CM

## 2016-09-08 NOTE — Progress Notes (Signed)
Jackie Jackson Sep 20, 1946 [id number]    History:    Presents for annual exam with no complaints. Postmenopausal, no HRT, no bleeding.  Not sexually active/husbands health.  Normal Pap and mammogram history.  Vaccines current.  Hypercholesterolemia managed by primary care.  Squamous skin cancer, has annual dermatology appointments.   Past medical history, past surgical history, family history and social history were all reviewed and documented in the EPIC chart. Retired, 2 children both doing well.  Husband, diabetes. Mother, 9 years old, lives independently, recently with stomach problems.  Father in law, stroke 1 week ago. Sister, lung cancer, in remission.   ROS:  A ROS was performed and pertinent positives and negatives are included.  Exam:  Vitals:   09/08/16 0948  BP: 138/70  Weight: 145 lb (65.8 kg)  Height: '5\' 2"'$  (1.575 m)   Body mass index is 26.52 kg/m.   General appearance:  Normal Thyroid:  Symmetrical, normal in size, without palpable masses or nodularity. Respiratory  Auscultation:  Clear without wheezing or rhonchi Cardiovascular  Auscultation:  Regular rate, without rubs, murmurs or gallops  Edema/varicosities:  Not grossly evident Abdominal  Soft,nontender, without masses, guarding or rebound.  Liver/spleen:  No organomegaly noted  Hernia:  None appreciated  Skin  Inspection:  Grossly normal   Breasts: Examined lying and sitting.     Right: Without masses, retractions, discharge or axillary adenopathy.     Left: Without masses, retractions, discharge or axillary adenopathy. Gentitourinary   Inguinal/mons:  Normal without inguinal adenopathy  External genitalia:  Normal  BUS/Urethra/Skene's glands:  Normal  Vagina:  Atrophy  Cervix:  Normal  Uterus:  Normal in size, shape and contour.  Midline and mobile  Adnexa/parametria:     Rt: Without masses or tenderness.   Lt: Without masses or tenderness.  Anus and perineum: Normal  Digital rectal exam: Normal  sphincter tone without palpated masses or tenderness    Assessment/Plan:  70 y.o. [name] G2 P2 for annual exam with no complaints.    Postmenopausal, no HRT, no bleeding Hypercolesterolemia, meds and labs managed by primary care Skin cancer, annual dermatology appointments.   Mammogram scheduled in January.  Cataract surgery scheduled for January.  Declines colonoscopy, plans to complete cologuard home kit . Bone density scheduled with primary care.  Reviewed home safety and fall precautions.  SBEs, continue screening mammogram, heart healthy diet, start back to regular  exercise.       Huel Cote [facility], 10:32 AM 09/08/2016

## 2016-09-08 NOTE — Patient Instructions (Signed)

## 2016-10-05 ENCOUNTER — Other Ambulatory Visit: Payer: Self-pay | Admitting: Women's Health

## 2016-10-05 DIAGNOSIS — Z1231 Encounter for screening mammogram for malignant neoplasm of breast: Secondary | ICD-10-CM

## 2016-10-09 LAB — COLOGUARD: Cologuard: NEGATIVE

## 2016-11-02 ENCOUNTER — Encounter: Payer: Self-pay | Admitting: *Deleted

## 2016-11-10 ENCOUNTER — Ambulatory Visit: Payer: Medicare Other

## 2016-12-15 ENCOUNTER — Ambulatory Visit
Admission: RE | Admit: 2016-12-15 | Discharge: 2016-12-15 | Disposition: A | Discharge status: Home / Self Care | Payer: Medicare Other | Source: Ambulatory Visit | Attending: Women's Health | Admitting: Women's Health

## 2016-12-15 DIAGNOSIS — Z1231 Encounter for screening mammogram for malignant neoplasm of breast: Secondary | ICD-10-CM

## 2017-01-21 ENCOUNTER — Other Ambulatory Visit: Payer: Self-pay | Admitting: Physician Assistant

## 2017-01-21 DIAGNOSIS — K219 Gastro-esophageal reflux disease without esophagitis: Secondary | ICD-10-CM

## 2017-01-21 DIAGNOSIS — E7800 Pure hypercholesterolemia, unspecified: Secondary | ICD-10-CM

## 2017-02-10 ENCOUNTER — Encounter: Payer: Self-pay | Admitting: Gynecology

## 2017-02-23 ENCOUNTER — Ambulatory Visit: Payer: Medicare Other | Admitting: Physician Assistant

## 2017-02-24 ENCOUNTER — Encounter: Payer: Self-pay | Admitting: Physician Assistant

## 2017-02-24 ENCOUNTER — Telehealth: Payer: Self-pay | Admitting: Physician Assistant

## 2017-02-24 NOTE — Telephone Encounter (Signed)
Detailed message left for patient to please call back to reschedule appointment.

## 2017-03-08 NOTE — Patient Instructions (Signed)
Your procedure is scheduled on: 03/22/2017  Report to [facility] at 1110  AM.  Call this number if you have problems the morning of surgery: [phone]   Do not eat food or drink liquids :After Midnight.      Take these medicines the morning of surgery with A SIP OF WATER: claritin, zantac.   Do not wear jewelry, make-up or nail polish.  Do not wear lotions, powders, or perfumes. You may wear deodorant.  Do not shave 48 hours prior to surgery.  Do not bring valuables to the hospital.  Contacts, dentures or bridgework may not be worn into surgery.  Leave suitcase in the car. After surgery it may be brought to your room.  For patients admitted to the hospital, checkout time is 11:00 AM the day of discharge.   Patients discharged the day of surgery will not be allowed to drive home.  :     Please read over the following fact sheets that you were given: Coughing and Deep Breathing, Surgical Site Infection Prevention, Anesthesia Post-op Instructions and Care and Recovery After Surgery    Cataract A cataract is a clouding of the lens of the eye. When a lens becomes cloudy, vision is reduced based on the degree and nature of the clouding. Many cataracts reduce vision to some degree. Some cataracts make people more near-sighted as they develop. Other cataracts increase glare. Cataracts that are ignored and become worse can sometimes look white. The white color can be seen through the pupil. CAUSES   Aging. However, cataracts may occur at any age, even in newborns.   Certain drugs.   Trauma to the eye.   Certain diseases such as diabetes.   Specific eye diseases such as chronic inflammation inside the eye or a sudden attack of a rare form of glaucoma.   Inherited or acquired medical problems.  SYMPTOMS   Gradual, progressive drop in vision in the affected eye.   Severe, rapid visual loss. This most often happens when trauma is the cause.  DIAGNOSIS  To detect a cataract, an eye  doctor examines the lens. Cataracts are best diagnosed with an exam of the eyes with the pupils enlarged (dilated) by drops.  TREATMENT  For an early cataract, vision may improve by using different eyeglasses or stronger lighting. If that does not help your vision, surgery is the only effective treatment. A cataract needs to be surgically removed when vision loss interferes with your everyday activities, such as driving, reading, or watching TV. A cataract may also have to be removed if it prevents examination or treatment of another eye problem. Surgery removes the cloudy lens and usually replaces it with a substitute lens (intraocular lens, IOL).  At a time when both you and your doctor agree, the cataract will be surgically removed. If you have cataracts in both eyes, only one is usually removed at a time. This allows the operated eye to heal and be out of danger from any possible problems after surgery (such as infection or poor wound healing). In rare cases, a cataract may be doing damage to your eye. In these cases, your caregiver may advise surgical removal right away. The vast majority of people who have cataract surgery have better vision afterward. HOME CARE INSTRUCTIONS  If you are not planning surgery, you may be asked to do the following:  Use different eyeglasses.   Use stronger or brighter lighting.   Ask your eye doctor about reducing your medicine dose or  changing medicines if it is thought that a medicine caused your cataract. Changing medicines does not make the cataract go away on its own.   Become familiar with your surroundings. Poor vision can lead to injury. Avoid bumping into things on the affected side. You are at a higher risk for tripping or falling.   Exercise extreme care when driving or operating machinery.   Wear sunglasses if you are sensitive to bright light or experiencing problems with glare.  SEEK IMMEDIATE MEDICAL CARE IF:   You have a worsening or sudden  vision loss.   You notice redness, swelling, or increasing pain in the eye.   You have a fever.  Document Released: 09/14/2005 Document Revised: 09/03/2011 Document Reviewed: 05/08/2011 [employer] Patient Information 2012 [place].PATIENT INSTRUCTIONS POST-ANESTHESIA  IMMEDIATELY FOLLOWING SURGERY:  Do not drive or operate machinery for the first twenty four hours after surgery.  Do not make any important decisions for twenty four hours after surgery or while taking narcotic pain medications or sedatives.  If you develop intractable nausea and vomiting or a severe headache please notify your doctor immediately.  FOLLOW-UP:  Please make an appointment with your surgeon as instructed. You do not need to follow up with anesthesia unless specifically instructed to do so.  WOUND CARE INSTRUCTIONS (if applicable):  Keep a dry clean dressing on the anesthesia/puncture wound site if there is drainage.  Once the wound has quit draining you may leave it open to air.  Generally you should leave the bandage intact for twenty four hours unless there is drainage.  If the epidural site drains for more than 36-48 hours please call the anesthesia department.  QUESTIONS?:  Please feel free to call your physician or the hospital operator if you have any questions, and they will be happy to assist you.

## 2017-03-12 ENCOUNTER — Encounter (HOSPITAL_COMMUNITY)
Admission: RE | Admit: 2017-03-12 | Discharge: 2017-03-12 | Disposition: A | Discharge status: Home / Self Care | Payer: Medicare Other | Source: Ambulatory Visit | Attending: Ophthalmology | Admitting: Ophthalmology

## 2017-03-12 ENCOUNTER — Encounter (HOSPITAL_COMMUNITY): Payer: Self-pay

## 2017-03-12 DIAGNOSIS — C801 Malignant (primary) neoplasm, unspecified: Secondary | ICD-10-CM | POA: Diagnosis not present

## 2017-03-12 DIAGNOSIS — M8580 Other specified disorders of bone density and structure, unspecified site: Secondary | ICD-10-CM | POA: Insufficient documentation

## 2017-03-12 DIAGNOSIS — K219 Gastro-esophageal reflux disease without esophagitis: Secondary | ICD-10-CM | POA: Insufficient documentation

## 2017-03-12 DIAGNOSIS — I517 Cardiomegaly: Secondary | ICD-10-CM | POA: Diagnosis not present

## 2017-03-12 DIAGNOSIS — Z01810 Encounter for preprocedural cardiovascular examination: Secondary | ICD-10-CM | POA: Insufficient documentation

## 2017-03-12 DIAGNOSIS — E7800 Pure hypercholesterolemia, unspecified: Secondary | ICD-10-CM | POA: Diagnosis not present

## 2017-03-12 DIAGNOSIS — Z01812 Encounter for preprocedural laboratory examination: Secondary | ICD-10-CM | POA: Insufficient documentation

## 2017-03-12 LAB — CBC WITH DIFFERENTIAL/PLATELET
Basophils Absolute: 0 10*3/uL (ref 0.0–0.1)
Basophils Relative: 1 %
Eosinophils Absolute: 0 10*3/uL (ref 0.0–0.7)
Eosinophils Relative: 1 %
HEMATOCRIT: 39.1 % (ref 36.0–46.0)
HEMOGLOBIN: 12.7 g/dL (ref 12.0–15.0)
LYMPHS ABS: 1.6 10*3/uL (ref 0.7–4.0)
Lymphocytes Relative: 36 %
MCH: 29.7 pg (ref 26.0–34.0)
MCHC: 32.5 g/dL (ref 30.0–36.0)
MCV: 91.4 fL (ref 78.0–100.0)
MONO ABS: 0.3 10*3/uL (ref 0.1–1.0)
MONOS PCT: 6 %
NEUTROS ABS: 2.6 10*3/uL (ref 1.7–7.7)
NEUTROS PCT: 58 %
Platelets: 256 10*3/uL (ref 150–400)
RBC: 4.28 MIL/uL (ref 3.87–5.11)
RDW: 13.5 % (ref 11.5–15.5)
WBC: 4.4 10*3/uL (ref 4.0–10.5)

## 2017-03-12 LAB — BASIC METABOLIC PANEL
Anion gap: 7 (ref 5–15)
BUN: 12 mg/dL (ref 6–20)
CALCIUM: 9.4 mg/dL (ref 8.9–10.3)
CHLORIDE: 105 mmol/L (ref 101–111)
CO2: 28 mmol/L (ref 22–32)
CREATININE: 0.86 mg/dL (ref 0.44–1.00)
GFR calc Af Amer: >60 mL/min (ref >60)
GFR calc non Af Amer: >60 mL/min (ref >60)
GLUCOSE: 80 mg/dL (ref 65–99)
Potassium: 4.1 mmol/L (ref 3.5–5.1)
Sodium: 140 mmol/L (ref 135–145)

## 2017-03-22 ENCOUNTER — Encounter (HOSPITAL_COMMUNITY): Payer: Self-pay | Admitting: *Deleted

## 2017-03-22 ENCOUNTER — Ambulatory Visit (HOSPITAL_COMMUNITY): Payer: Medicare Other | Admitting: Anesthesiology

## 2017-03-22 ENCOUNTER — Ambulatory Visit (HOSPITAL_COMMUNITY)
Admission: RE | Admit: 2017-03-22 | Discharge: 2017-03-22 | Disposition: A | Discharge status: Home / Self Care | Payer: Medicare Other | Source: Ambulatory Visit | Attending: Ophthalmology | Admitting: Ophthalmology

## 2017-03-22 ENCOUNTER — Encounter (HOSPITAL_COMMUNITY)
Admission: RE | Disposition: A | Discharge status: Home / Self Care | Payer: Self-pay | Source: Ambulatory Visit | Attending: Ophthalmology

## 2017-03-22 DIAGNOSIS — E7800 Pure hypercholesterolemia, unspecified: Secondary | ICD-10-CM | POA: Diagnosis not present

## 2017-03-22 DIAGNOSIS — Z888 Allergy status to other drugs, medicaments and biological substances status: Secondary | ICD-10-CM | POA: Diagnosis not present

## 2017-03-22 DIAGNOSIS — J302 Other seasonal allergic rhinitis: Secondary | ICD-10-CM | POA: Diagnosis not present

## 2017-03-22 DIAGNOSIS — K219 Gastro-esophageal reflux disease without esophagitis: Secondary | ICD-10-CM | POA: Insufficient documentation

## 2017-03-22 DIAGNOSIS — Z79899 Other long term (current) drug therapy: Secondary | ICD-10-CM | POA: Diagnosis not present

## 2017-03-22 DIAGNOSIS — H2512 Age-related nuclear cataract, left eye: Secondary | ICD-10-CM | POA: Insufficient documentation

## 2017-03-22 DIAGNOSIS — M8580 Other specified disorders of bone density and structure, unspecified site: Secondary | ICD-10-CM | POA: Insufficient documentation

## 2017-03-22 HISTORY — PX: CATARACT EXTRACTION W/PHACO: SHX586

## 2017-03-22 SURGERY — PHACOEMULSIFICATION, CATARACT, WITH IOL INSERTION
Anesthesia: Monitor Anesthesia Care | Site: Eye | Laterality: Left

## 2017-03-22 MED ORDER — NEOMYCIN-POLYMYXIN-DEXAMETH 3.5-10000-0.1 OP SUSP
OPHTHALMIC | Status: DC | PRN
Start: 2017-03-22 — End: 2017-03-22
  Administered 2017-03-22: 2 [drp] via OPHTHALMIC

## 2017-03-22 MED ORDER — BSS IO SOLN
INTRAOCULAR | Status: DC | PRN
  Administered 2017-03-22: 15 mL

## 2017-03-22 MED ORDER — TIMOLOL MALEATE 0.5 % OP SOLN
1.0000 [drp] | Freq: Once | OPHTHALMIC | Status: AC
  Administered 2017-03-22: 1 [drp] via OPHTHALMIC
  Filled 2017-03-22: qty 5

## 2017-03-22 MED ORDER — LACTATED RINGERS IV SOLN
INTRAVENOUS | Status: DC
  Administered 2017-03-22: 1000 mL via INTRAVENOUS

## 2017-03-22 MED ORDER — BSS IO SOLN
INTRAOCULAR | Status: DC | PRN
  Administered 2017-03-22: 500 mL

## 2017-03-22 MED ORDER — CYCLOPENTOLATE-PHENYLEPHRINE 0.2-1 % OP SOLN
1.0000 [drp] | OPHTHALMIC | Status: AC
  Administered 2017-03-22 (×3): 1 [drp] via OPHTHALMIC

## 2017-03-22 MED ORDER — TETRACAINE HCL 0.5 % OP SOLN
1.0000 [drp] | OPHTHALMIC | Status: AC
  Administered 2017-03-22 (×3): 1 [drp] via OPHTHALMIC

## 2017-03-22 MED ORDER — PROVISC 10 MG/ML IO SOLN
INTRAOCULAR | Status: DC | PRN
  Administered 2017-03-22: 0.85 mL via INTRAOCULAR

## 2017-03-22 MED ORDER — MIDAZOLAM HCL 2 MG/2ML IJ SOLN
INTRAMUSCULAR | Status: AC
  Filled 2017-03-22: qty 2

## 2017-03-22 MED ORDER — MIDAZOLAM HCL 2 MG/2ML IJ SOLN
1.0000 mg | Freq: Once | INTRAMUSCULAR | Status: AC | PRN
  Administered 2017-03-22: 2 mg via INTRAVENOUS

## 2017-03-22 MED ORDER — POVIDONE-IODINE 5 % OP SOLN
OPHTHALMIC | Status: DC | PRN
  Administered 2017-03-22: 1 via OPHTHALMIC

## 2017-03-22 MED ORDER — LIDOCAINE HCL (PF) 1 % IJ SOLN
INTRAMUSCULAR | Status: DC | PRN
  Administered 2017-03-22: .4 mL

## 2017-03-22 MED ORDER — LIDOCAINE HCL 3.5 % OP GEL
1.0000 "application " | Freq: Once | OPHTHALMIC | Status: AC
  Administered 2017-03-22: 1 via OPHTHALMIC

## 2017-03-22 MED ORDER — PHENYLEPHRINE HCL 2.5 % OP SOLN
1.0000 [drp] | OPHTHALMIC | Status: AC
  Administered 2017-03-22 (×3): 1 [drp] via OPHTHALMIC

## 2017-03-22 SURGICAL SUPPLY — 10 items
CLOTH BEACON ORANGE TIMEOUT ST (SAFETY) ×2 IMPLANT
EYE SHIELD UNIVERSAL CLEAR (GAUZE/BANDAGES/DRESSINGS) ×2 IMPLANT
GLOVE BIOGEL PI IND STRL 7.0 (GLOVE) IMPLANT
GLOVE BIOGEL PI INDICATOR 7.0 (GLOVE) ×4
PAD ARMBOARD 7.5X6 YLW CONV (MISCELLANEOUS) ×2 IMPLANT
SIGHTPATH CAT PROC W REG LENS (Ophthalmic Related) ×3 IMPLANT
SYRINGE LUER LOK 1CC (MISCELLANEOUS) ×2 IMPLANT
TAPE SURG TRANSPORE 1 IN (GAUZE/BANDAGES/DRESSINGS) IMPLANT
TAPE SURGICAL TRANSPORE 1 IN (GAUZE/BANDAGES/DRESSINGS) ×2
WATER STERILE IRR 250ML POUR (IV SOLUTION) ×2 IMPLANT

## 2017-03-22 NOTE — Anesthesia Postprocedure Evaluation (Signed)
Anesthesia Post Note  Patient: Jackie Jackson  Procedure(s) Performed: Procedure(s) (LRB): CATARACT EXTRACTION PHACO AND INTRAOCULAR LENS PLACEMENT (IOC) (Left)  Patient location during evaluation: Short Stay Anesthesia Type: MAC Level of consciousness: awake and alert, oriented and patient cooperative Pain management: pain level controlled Vital Signs Assessment: post-procedure vital signs reviewed and stable Respiratory status: spontaneous breathing and nonlabored ventilation Cardiovascular status: blood pressure returned to baseline and stable Postop Assessment: no headache, adequate PO intake, patient able to bend at knees and no signs of nausea or vomiting Anesthetic complications: no     Last Vitals:  Vitals:   03/22/17 1300 03/22/17 1305  BP: (!) 146/74 134/76  Pulse:    Resp: 13 16  Temp:      Last Pain:  Vitals:   03/22/17 1135  TempSrc: Oral                 Jackie Jackson

## 2017-03-22 NOTE — Op Note (Addendum)
Date of Admission: 03/22/2017  Date of Surgery: 03/22/2017  Pre-Op Dx: Cataract Left  Eye  Post-Op Dx: Senile Nuclear Cataract  Left  Eye,  Dx Code H25.12  Surgeon: Tonny Branch, M.D.  Assistants: None  Anesthesia: Topical with MAC  Indications: Painless, progressive loss of vision with compromise of daily activities.  Surgery: Cataract Extraction with Intraocular lens Implant Left Eye  Discription: The patient had dilating drops and viscous lidocaine placed into the Left eye in the pre-op holding area. After transfer to the operating room, a time out was performed. The patient was then prepped and draped. Beginning with a 58m blade a paracentesis port was made at the surgeon's 2 o'clock position. The anterior chamber was then filled with 1% non-preserved lidocaine. This was followed by filling the anterior chamber with Provisc.  A 2.485mkeratome blade was used to make a clear corneal incision at the temporal limbus.  A bent cystatome needle was used to create a continuous tear capsulotomy. Hydrodissection was performed with balanced salt solution on a Fine canula. The lens nucleus was then removed using the phacoemulsification handpiece. Residual cortex was removed with the I&A handpiece. The anterior chamber and capsular bag were refilled with Provisc. A posterior chamber intraocular lens was placed into the capsular bag with it's injector. The implant was positioned with the Kuglan hook. The Provisc was then removed from the anterior chamber and capsular bag with the I&A handpiece. Stromal hydration of the main incision and paracentesis port was performed with BSS on a Fine canula. The wounds were tested for leak which was negative. The patient reported some discomfort and it was noted that the anterior chamber was shallow and the eye firm. No change in the red reflex was seen. Aqueous misdirection was suspected.  I decided not to decompress the eye further and that we will place her on some aqueous  supressents.  The patient tolerated the procedure well. There were no operative complications. The patient was then transferred to the recovery room in stable condition.  Complications: None  Specimen: None  EBL: None  Prosthetic device: Abbott Technis, PCB00, power 22.5, SN [id number]

## 2017-03-22 NOTE — Transfer of Care (Signed)
Immediate Anesthesia Transfer of Care Note  Patient: Jackie Jackson  Procedure(s) Performed: Procedure(s) with comments: CATARACT EXTRACTION PHACO AND INTRAOCULAR LENS PLACEMENT (IOC) (Left) - CDE:  8.65  Patient Location: Short Stay  Anesthesia Type:MAC  Level of Consciousness: awake, alert , oriented and patient cooperative  Airway & Oxygen Therapy: Patient Spontanous Breathing  Post-op Assessment: Report given to RN and Post -op Vital signs reviewed and stable  Post vital signs: Reviewed and stable  Last Vitals:  Vitals:   03/22/17 1300 03/22/17 1305  BP: (!) 146/74 134/76  Pulse:    Resp: 13 16  Temp:      Last Pain:  Vitals:   03/22/17 1135  TempSrc: Oral      Patients Stated Pain Goal: 8 (87/57/97 2820)  Complications: No apparent anesthesia complications

## 2017-03-22 NOTE — Discharge Instructions (Signed)
Moderate Conscious Sedation, Adult, Care After  These instructions provide you with information about caring for yourself after your procedure. Your health care provider may also give you more specific instructions. Your treatment has been planned according to current medical practices, but problems sometimes occur. Call your health care provider if you have any problems or questions after your procedure.  What can I expect after the procedure?  After your procedure, it is common:   To feel sleepy for several hours.   To feel clumsy and have poor balance for several hours.   To have poor judgment for several hours.   To vomit if you eat too soon.    Follow these instructions at home:  For at least 24 hours after the procedure:     Do not:  ? Participate in activities where you could fall or become injured.  ? Drive.  ? Use heavy machinery.  ? Drink alcohol.  ? Take sleeping pills or medicines that cause drowsiness.  ? Make important decisions or sign legal documents.  ? Take care of children on your own.   Rest.  Eating and drinking   Follow the diet recommended by your health care provider.   If you vomit:  ? Drink water, juice, or soup when you can drink without vomiting.  ? Make sure you have little or no nausea before eating solid foods.  General instructions   Have a responsible adult stay with you until you are awake and alert.   Take over-the-counter and prescription medicines only as told by your health care provider.   If you smoke, do not smoke without supervision.   Keep all follow-up visits as told by your health care provider. This is important.  Contact a health care provider if:   You keep feeling nauseous or you keep vomiting.   You feel light-headed.   You develop a rash.   You have a fever.  Get help right away if:   You have trouble breathing.  This information is not intended to replace advice given to you by your health care provider. Make sure you discuss any questions you have  with your health care provider.  Document Released: 07/05/2013 Document Revised: 02/17/2016 Document Reviewed: 01/04/2016  Elsevier Interactive Patient Education  2018 [employer]

## 2017-03-22 NOTE — H&P (Signed)
I have reviewed the H&P, the patient was re-examined, and I have identified no interval changes in medical condition and plan of care since the history and physical of record  

## 2017-03-22 NOTE — Anesthesia Preprocedure Evaluation (Signed)
Anesthesia Evaluation  Patient identified by MRN, date of birth, ID band Patient awake    Airway Mallampati: I       Dental no notable dental hx. (+) Teeth Intact   Pulmonary neg pulmonary ROS,    Pulmonary exam normal        Cardiovascular negative cardio ROS Normal cardiovascular exam Rhythm:Regular Rate:Normal     Neuro/Psych Anxiety negative neurological ROS     GI/Hepatic Neg liver ROS, GERD  ,  Endo/Other  negative endocrine ROS  Renal/GU negative Renal ROS  negative genitourinary   Musculoskeletal osteopenia   Abdominal Normal abdominal exam  (+)   Peds negative pediatric ROS (+)  Hematology negative hematology ROS (+)   Anesthesia Other Findings   Reproductive/Obstetrics                             Anesthesia Physical Anesthesia Plan  ASA: II  Anesthesia Plan: MAC   Post-op Pain Management:    Induction: Intravenous  PONV Risk Score and Plan:   Airway Management Planned: Nasal Cannula  Additional Equipment:   Intra-op Plan:   Post-operative Plan:   Informed Consent: I have reviewed the patients History and Physical, chart, labs and discussed the procedure including the risks, benefits and alternatives for the proposed anesthesia with the patient or authorized representative who has indicated his/her understanding and acceptance.     Plan Discussed with: CRNA  Anesthesia Plan Comments:         Anesthesia Quick Evaluation

## 2017-03-23 ENCOUNTER — Encounter (HOSPITAL_COMMUNITY): Payer: Self-pay | Admitting: Ophthalmology

## 2017-03-23 NOTE — Addendum Note (Signed)
Addendum  created 03/23/17 0820 by Mikey College, MD   Anesthesia Attestations filed

## 2017-04-02 ENCOUNTER — Encounter (HOSPITAL_COMMUNITY)
Admission: RE | Admit: 2017-04-02 | Discharge: 2017-04-02 | Disposition: A | Discharge status: Home / Self Care | Payer: Medicare Other | Source: Ambulatory Visit | Attending: Ophthalmology | Admitting: Ophthalmology

## 2017-04-05 ENCOUNTER — Encounter (HOSPITAL_COMMUNITY): Payer: Self-pay | Admitting: *Deleted

## 2017-04-05 ENCOUNTER — Ambulatory Visit (HOSPITAL_COMMUNITY)
Admission: RE | Admit: 2017-04-05 | Discharge: 2017-04-05 | Disposition: A | Discharge status: Home / Self Care | Payer: Medicare Other | Source: Ambulatory Visit | Attending: Ophthalmology | Admitting: Ophthalmology

## 2017-04-05 ENCOUNTER — Ambulatory Visit (HOSPITAL_COMMUNITY): Payer: Medicare Other | Admitting: Anesthesiology

## 2017-04-05 ENCOUNTER — Encounter (HOSPITAL_COMMUNITY)
Admission: RE | Disposition: A | Discharge status: Home / Self Care | Payer: Self-pay | Source: Ambulatory Visit | Attending: Ophthalmology

## 2017-04-05 DIAGNOSIS — E7800 Pure hypercholesterolemia, unspecified: Secondary | ICD-10-CM | POA: Insufficient documentation

## 2017-04-05 DIAGNOSIS — H2511 Age-related nuclear cataract, right eye: Secondary | ICD-10-CM | POA: Diagnosis present

## 2017-04-05 DIAGNOSIS — Z79899 Other long term (current) drug therapy: Secondary | ICD-10-CM | POA: Insufficient documentation

## 2017-04-05 DIAGNOSIS — F419 Anxiety disorder, unspecified: Secondary | ICD-10-CM | POA: Insufficient documentation

## 2017-04-05 DIAGNOSIS — K219 Gastro-esophageal reflux disease without esophagitis: Secondary | ICD-10-CM | POA: Insufficient documentation

## 2017-04-05 DIAGNOSIS — Z9109 Other allergy status, other than to drugs and biological substances: Secondary | ICD-10-CM | POA: Diagnosis not present

## 2017-04-05 DIAGNOSIS — Z888 Allergy status to other drugs, medicaments and biological substances status: Secondary | ICD-10-CM | POA: Diagnosis not present

## 2017-04-05 DIAGNOSIS — M8580 Other specified disorders of bone density and structure, unspecified site: Secondary | ICD-10-CM | POA: Insufficient documentation

## 2017-04-05 HISTORY — PX: CATARACT EXTRACTION W/PHACO: SHX586

## 2017-04-05 SURGERY — PHACOEMULSIFICATION, CATARACT, WITH IOL INSERTION
Anesthesia: Monitor Anesthesia Care | Site: Eye | Laterality: Right

## 2017-04-05 MED ORDER — MIDAZOLAM HCL 2 MG/2ML IJ SOLN
INTRAMUSCULAR | Status: AC
  Filled 2017-04-05: qty 2

## 2017-04-05 MED ORDER — POVIDONE-IODINE 5 % OP SOLN
OPHTHALMIC | Status: DC | PRN
  Administered 2017-04-05: 1 via OPHTHALMIC

## 2017-04-05 MED ORDER — BSS IO SOLN
INTRAOCULAR | Status: DC | PRN
  Administered 2017-04-05: 15 mL via INTRAOCULAR

## 2017-04-05 MED ORDER — EPINEPHRINE PF 1 MG/ML IJ SOLN
INTRAMUSCULAR | Status: DC | PRN
  Administered 2017-04-05: 500 mL

## 2017-04-05 MED ORDER — FENTANYL CITRATE (PF) 100 MCG/2ML IJ SOLN
25.0000 ug | Freq: Once | INTRAMUSCULAR | Status: AC
  Administered 2017-04-05: 25 ug via INTRAVENOUS
  Filled 2017-04-05: qty 2

## 2017-04-05 MED ORDER — NEOMYCIN-POLYMYXIN-DEXAMETH 3.5-10000-0.1 OP SUSP
OPHTHALMIC | Status: DC | PRN
  Administered 2017-04-05: 1 [drp]

## 2017-04-05 MED ORDER — LIDOCAINE HCL (PF) 1 % IJ SOLN
INTRAMUSCULAR | Status: DC | PRN
  Administered 2017-04-05: .5 mL

## 2017-04-05 MED ORDER — LIDOCAINE HCL 3.5 % OP GEL
1.0000 "application " | Freq: Once | OPHTHALMIC | Status: AC
  Administered 2017-04-05: 1 via OPHTHALMIC

## 2017-04-05 MED ORDER — CYCLOPENTOLATE-PHENYLEPHRINE 0.2-1 % OP SOLN
1.0000 [drp] | OPHTHALMIC | Status: AC
  Administered 2017-04-05 (×3): 1 [drp] via OPHTHALMIC

## 2017-04-05 MED ORDER — TETRACAINE HCL 0.5 % OP SOLN
1.0000 [drp] | OPHTHALMIC | Status: AC
  Administered 2017-04-05 (×3): 1 [drp] via OPHTHALMIC

## 2017-04-05 MED ORDER — MIDAZOLAM HCL 2 MG/2ML IJ SOLN
1.0000 mg | INTRAMUSCULAR | Status: AC
  Administered 2017-04-05: 2 mg via INTRAVENOUS

## 2017-04-05 MED ORDER — LACTATED RINGERS IV SOLN
INTRAVENOUS | Status: DC
  Administered 2017-04-05: 09:00:00 via INTRAVENOUS

## 2017-04-05 MED ORDER — PHENYLEPHRINE HCL 2.5 % OP SOLN
1.0000 [drp] | OPHTHALMIC | Status: AC
  Administered 2017-04-05 (×3): 1 [drp] via OPHTHALMIC

## 2017-04-05 MED ORDER — PROVISC 10 MG/ML IO SOLN
INTRAOCULAR | Status: DC | PRN
  Administered 2017-04-05: 0.85 mL via INTRAOCULAR

## 2017-04-05 SURGICAL SUPPLY — 21 items
CAPSULAR TENSION RING-AMO (OPHTHALMIC RELATED) IMPLANT
CLOTH BEACON ORANGE TIMEOUT ST (SAFETY) ×2 IMPLANT
EYE SHIELD UNIVERSAL CLEAR (GAUZE/BANDAGES/DRESSINGS) ×2 IMPLANT
GLOVE BIOGEL PI IND STRL 6.5 (GLOVE) IMPLANT
GLOVE BIOGEL PI IND STRL 7.0 (GLOVE) IMPLANT
GLOVE BIOGEL PI INDICATOR 6.5 (GLOVE)
GLOVE BIOGEL PI INDICATOR 7.0 (GLOVE) ×2
GLOVE EXAM NITRILE LRG STRL (GLOVE) ×2 IMPLANT
GLOVE EXAM NITRILE MD LF STRL (GLOVE) IMPLANT
KIT VITRECTOMY (OPHTHALMIC RELATED) IMPLANT
LENS ALC ACRYL/TECN (Ophthalmic Related) ×2 IMPLANT
PAD ARMBOARD 7.5X6 YLW CONV (MISCELLANEOUS) ×2 IMPLANT
PROC W NO LENS (INTRAOCULAR LENS)
PROC W SPEC LENS (INTRAOCULAR LENS)
PROCESS W NO LENS (INTRAOCULAR LENS) IMPLANT
PROCESS W SPEC LENS (INTRAOCULAR LENS) IMPLANT
RETRACTOR IRIS SIGHTPATH (OPHTHALMIC RELATED) IMPLANT
RING MALYGIN (MISCELLANEOUS) IMPLANT
SYRINGE LUER LOK 1CC (MISCELLANEOUS) ×2 IMPLANT
VISCOELASTIC ADDITIONAL (OPHTHALMIC RELATED) IMPLANT
WATER STERILE IRR 250ML POUR (IV SOLUTION) ×2 IMPLANT

## 2017-04-05 NOTE — Anesthesia Procedure Notes (Signed)
Procedure Name: MAC Date/Time: 04/05/2017 9:57 AM Performed by: Vista Deck Pre-anesthesia Checklist: Patient identified, Emergency Drugs available, Suction available, Timeout performed and Patient being monitored Patient Re-evaluated:Patient Re-evaluated prior to inductionOxygen Delivery Method: Nasal Cannula

## 2017-04-05 NOTE — Discharge Instructions (Signed)

## 2017-04-05 NOTE — H&P (Signed)
I have reviewed the H&P, the patient was re-examined, and I have identified no interval changes in medical condition and plan of care since the history and physical of record  

## 2017-04-05 NOTE — Anesthesia Preprocedure Evaluation (Signed)
Anesthesia Evaluation  Patient identified by MRN, date of birth, ID band Patient awake    Airway Mallampati: I       Dental no notable dental hx. (+) Teeth Intact   Pulmonary neg pulmonary ROS,    Pulmonary exam normal        Cardiovascular negative cardio ROS Normal cardiovascular exam Rhythm:Regular Rate:Normal     Neuro/Psych Anxiety negative neurological ROS     GI/Hepatic Neg liver ROS, GERD  ,  Endo/Other  negative endocrine ROS  Renal/GU negative Renal ROS  negative genitourinary   Musculoskeletal osteopenia   Abdominal Normal abdominal exam  (+)   Peds negative pediatric ROS (+)  Hematology negative hematology ROS (+)   Anesthesia Other Findings   Reproductive/Obstetrics                             Anesthesia Physical Anesthesia Plan  ASA: II  Anesthesia Plan: MAC   Post-op Pain Management:    Induction: Intravenous  PONV Risk Score and Plan:   Airway Management Planned: Nasal Cannula  Additional Equipment:   Intra-op Plan:   Post-operative Plan:   Informed Consent: I have reviewed the patients History and Physical, chart, labs and discussed the procedure including the risks, benefits and alternatives for the proposed anesthesia with the patient or authorized representative who has indicated his/her understanding and acceptance.     Plan Discussed with: CRNA  Anesthesia Plan Comments:         Anesthesia Quick Evaluation

## 2017-04-05 NOTE — Transfer of Care (Signed)
Immediate Anesthesia Transfer of Care Note  Patient: Jackie Jackson  Procedure(s) Performed: Procedure(s) (LRB): CATARACT EXTRACTION PHACO AND INTRAOCULAR LENS PLACEMENT (IOC) (Right)  Patient Location: Shortstay  Anesthesia Type: MAC  Level of Consciousness: awake  Airway & Oxygen Therapy: Patient Spontanous Breathing   Post-op Assessment: Report given to PACU RN, Post -op Vital signs reviewed and stable and Patient moving all extremities  Post vital signs: Reviewed and stable  Complications: No apparent anesthesia complications

## 2017-04-05 NOTE — Op Note (Signed)
Date of Admission: 04/05/2017  Date of Surgery: 04/05/2017  Pre-Op Dx: Cataract Right  Eye  Post-Op Dx: Senile Nuclear Cataract  Right  Eye,  Dx Code H25.11  Surgeon: Tonny Branch, M.D.  Assistants: None  Anesthesia: Topical with MAC  Indications: Painless, progressive loss of vision with compromise of daily activities.  Surgery: Cataract Extraction with Intraocular lens Implant Right Eye  Discription: The patient had dilating drops and viscous lidocaine placed into the Right eye in the pre-op holding area. After transfer to the operating room, a time out was performed. The patient was then prepped and draped. Beginning with a 86m blade a paracentesis port was made at the surgeon's 2 o'clock position. The anterior chamber was then filled with 1% non-preserved lidocaine. This was followed by filling the anterior chamber with Provisc.  A 2.431mkeratome blade was used to make a clear corneal incision at the temporal limbus.  A bent cystatome needle was used to create a continuous tear capsulotomy. Hydrodissection was performed with balanced salt solution on a Fine canula. The lens nucleus was then removed using the phacoemulsification handpiece. Residual cortex was removed with the I&A handpiece. The anterior chamber and capsular bag were refilled with Provisc. A posterior chamber intraocular lens was placed into the capsular bag with it's injector. The implant was positioned with the Kuglan hook. The Provisc was then removed from the anterior chamber and capsular bag with the I&A handpiece. Stromal hydration of the main incision and paracentesis port was performed with BSS on a Fine canula. The wounds were tested for leak which was negative. The patient tolerated the procedure well. There were no operative complications. The patient was then transferred to the recovery room in stable condition.  Complications: None  Specimen: None  EBL: None  Prosthetic device: Abbott Technis, PCB00, power 22.5, SN  [id number]

## 2017-04-05 NOTE — Anesthesia Postprocedure Evaluation (Signed)
Anesthesia Post Note  Patient: Jackie Jackson  Procedure(s) Performed: Procedure(s) (LRB): CATARACT EXTRACTION PHACO AND INTRAOCULAR LENS PLACEMENT (IOC) (Right)  Patient location during evaluation: Short Stay Anesthesia Type: MAC Level of consciousness: awake and alert Pain management: satisfactory to patient Vital Signs Assessment: post-procedure vital signs reviewed and stable Respiratory status: spontaneous breathing Cardiovascular status: stable Postop Assessment: no signs of nausea or vomiting Anesthetic complications: no     Last Vitals:  Vitals:   04/05/17 0925 04/05/17 0930  BP: 136/71 (!) 144/78  Resp: 12 11  Temp:      Last Pain:  Vitals:   04/05/17 0845  TempSrc: Oral                 Delmy Holdren

## 2017-04-06 ENCOUNTER — Encounter (HOSPITAL_COMMUNITY): Payer: Self-pay | Admitting: Ophthalmology

## 2017-06-09 ENCOUNTER — Other Ambulatory Visit: Payer: Self-pay | Admitting: Physician Assistant

## 2017-06-09 DIAGNOSIS — K219 Gastro-esophageal reflux disease without esophagitis: Secondary | ICD-10-CM

## 2017-06-09 DIAGNOSIS — E7800 Pure hypercholesterolemia, unspecified: Secondary | ICD-10-CM

## 2017-09-14 ENCOUNTER — Encounter: Payer: Self-pay | Admitting: Women's Health

## 2017-09-14 ENCOUNTER — Ambulatory Visit: Payer: Medicare Other | Admitting: Women's Health

## 2017-09-14 VITALS — BP 140/78 | Ht 62.0 in | Wt 148.0 lb

## 2017-09-14 DIAGNOSIS — Z01419 Encounter for gynecological examination (general) (routine) without abnormal findings: Secondary | ICD-10-CM

## 2017-09-14 NOTE — Patient Instructions (Signed)
Health Maintenance for Postmenopausal Women Menopause is a normal process in which your reproductive ability comes to an end. This process happens gradually over a span of months to years, usually between the ages of 22 and 9. Menopause is complete when you have missed 12 consecutive menstrual periods. It is important to talk with your health care provider about some of the most common conditions that affect postmenopausal women, such as heart disease, cancer, and bone loss (osteoporosis). Adopting a healthy lifestyle and getting preventive care can help to promote your health and wellness. Those actions can also lower your chances of developing some of these common conditions. What should I know about menopause? During menopause, you may experience a number of symptoms, such as:  Moderate-to-severe hot flashes.  Night sweats.  Decrease in sex drive.  Mood swings.  Headaches.  Tiredness.  Irritability.  Memory problems.  Insomnia.  Choosing to treat or not to treat menopausal changes is an individual decision that you make with your health care provider. What should I know about hormone replacement therapy and supplements? Hormone therapy products are effective for treating symptoms that are associated with menopause, such as hot flashes and night sweats. Hormone replacement carries certain risks, especially as you become older. If you are thinking about using estrogen or estrogen with progestin treatments, discuss the benefits and risks with your health care provider. What should I know about heart disease and stroke? Heart disease, heart attack, and stroke become more likely as you age. This may be due, in part, to the hormonal changes that your body experiences during menopause. These can affect how your body processes dietary fats, triglycerides, and cholesterol. Heart attack and stroke are both medical emergencies. There are many things that you can do to help prevent heart disease  and stroke:  Have your blood pressure checked at least every 1-2 years. High blood pressure causes heart disease and increases the risk of stroke.  If you are 53-22 years old, ask your health care provider if you should take aspirin to prevent a heart attack or a stroke.  Do not use any tobacco products, including cigarettes, chewing tobacco, or electronic cigarettes. If you need help quitting, ask your health care provider.  It is important to eat a healthy diet and maintain a healthy weight. ? Be sure to include plenty of vegetables, fruits, low-fat dairy products, and lean protein. ? Avoid eating foods that are high in solid fats, added sugars, or salt (sodium).  Get regular exercise. This is one of the most important things that you can do for your health. ? Try to exercise for at least 150 minutes each week. The type of exercise that you do should increase your heart rate and make you sweat. This is known as moderate-intensity exercise. ? Try to do strengthening exercises at least twice each week. Do these in addition to the moderate-intensity exercise.  Know your numbers.Ask your health care provider to check your cholesterol and your blood glucose. Continue to have your blood tested as directed by your health care provider.  What should I know about cancer screening? There are several types of cancer. Take the following steps to reduce your risk and to catch any cancer development as early as possible. Breast Cancer  Practice breast self-awareness. ? This means understanding how your breasts normally appear and feel. ? It also means doing regular breast self-exams. Let your health care provider know about any changes, no matter how small.  If you are 40  or older, have a clinician do a breast exam (clinical breast exam or CBE) every year. Depending on your age, family history, and medical history, it may be recommended that you also have a yearly breast X-ray (mammogram).  If you  have a family history of breast cancer, talk with your health care provider about genetic screening.  If you are at high risk for breast cancer, talk with your health care provider about having an MRI and a mammogram every year.  Breast cancer (BRCA) gene test is recommended for women who have family members with BRCA-related cancers. Results of the assessment will determine the need for genetic counseling and BRCA1 and for BRCA2 testing. BRCA-related cancers include these types: ? Breast. This occurs in males or females. ? Ovarian. ? Tubal. This may also be called fallopian tube cancer. ? Cancer of the abdominal or pelvic lining (peritoneal cancer). ? Prostate. ? Pancreatic.  Cervical, Uterine, and Ovarian Cancer Your health care provider may recommend that you be screened regularly for cancer of the pelvic organs. These include your ovaries, uterus, and vagina. This screening involves a pelvic exam, which includes checking for microscopic changes to the surface of your cervix (Pap test).  For women ages 21-65, health care providers may recommend a pelvic exam and a Pap test every three years. For women ages 79-65, they may recommend the Pap test and pelvic exam, combined with testing for human papilloma virus (HPV), every five years. Some types of HPV increase your risk of cervical cancer. Testing for HPV may also be done on women of any age who have unclear Pap test results.  Other health care providers may not recommend any screening for nonpregnant women who are considered low risk for pelvic cancer and have no symptoms. Ask your health care provider if a screening pelvic exam is right for you.  If you have had past treatment for cervical cancer or a condition that could lead to cancer, you need Pap tests and screening for cancer for at least 20 years after your treatment. If Pap tests have been discontinued for you, your risk factors (such as having a new sexual partner) need to be  reassessed to determine if you should start having screenings again. Some women have medical problems that increase the chance of getting cervical cancer. In these cases, your health care provider may recommend that you have screening and Pap tests more often.  If you have a family history of uterine cancer or ovarian cancer, talk with your health care provider about genetic screening.  If you have vaginal bleeding after reaching menopause, tell your health care provider.  There are currently no reliable tests available to screen for ovarian cancer.  Lung Cancer Lung cancer screening is recommended for adults 69-62 years old who are at high risk for lung cancer because of a history of smoking. A yearly low-dose CT scan of the lungs is recommended if you:  Currently smoke.  Have a history of at least 30 pack-years of smoking and you currently smoke or have quit within the past 15 years. A pack-year is smoking an average of one pack of cigarettes per day for one year.  Yearly screening should:  Continue until it has been 15 years since you quit.  Stop if you develop a health problem that would prevent you from having lung cancer treatment.  Colorectal Cancer  This type of cancer can be detected and can often be prevented.  Routine colorectal cancer screening usually begins at  age 42 and continues through age 45.  If you have risk factors for colon cancer, your health care provider may recommend that you be screened at an earlier age.  If you have a family history of colorectal cancer, talk with your health care provider about genetic screening.  Your health care provider may also recommend using home test kits to check for hidden blood in your stool.  A small camera at the end of a tube can be used to examine your colon directly (sigmoidoscopy or colonoscopy). This is done to check for the earliest forms of colorectal cancer.  Direct examination of the colon should be repeated every  5-10 years until age 71. However, if early forms of precancerous polyps or small growths are found or if you have a family history or genetic risk for colorectal cancer, you may need to be screened more often.  Skin Cancer  Check your skin from head to toe regularly.  Monitor any moles. Be sure to tell your health care provider: ? About any new moles or changes in moles, especially if there is a change in a mole's shape or color. ? If you have a mole that is larger than the size of a pencil eraser.  If any of your family members has a history of skin cancer, especially at a young age, talk with your health care provider about genetic screening.  Always use sunscreen. Apply sunscreen liberally and repeatedly throughout the day.  Whenever you are outside, protect yourself by wearing long sleeves, pants, a wide-brimmed hat, and sunglasses.  What should I know about osteoporosis? Osteoporosis is a condition in which bone destruction happens more quickly than new bone creation. After menopause, you may be at an increased risk for osteoporosis. To help prevent osteoporosis or the bone fractures that can happen because of osteoporosis, the following is recommended:  If you are 46-71 years old, get at least 1,000 mg of calcium and at least 600 mg of vitamin D per day.  If you are older than age 55 but younger than age 65, get at least 1,200 mg of calcium and at least 600 mg of vitamin D per day.  If you are older than age 54, get at least 1,200 mg of calcium and at least 800 mg of vitamin D per day.  Smoking and excessive alcohol intake increase the risk of osteoporosis. Eat foods that are rich in calcium and vitamin D, and do weight-bearing exercises several times each week as directed by your health care provider. What should I know about how menopause affects my mental health? Depression may occur at any age, but it is more common as you become older. Common symptoms of depression  include:  Low or sad mood.  Changes in sleep patterns.  Changes in appetite or eating patterns.  Feeling an overall lack of motivation or enjoyment of activities that you previously enjoyed.  Frequent crying spells.  Talk with your health care provider if you think that you are experiencing depression. What should I know about immunizations? It is important that you get and maintain your immunizations. These include:  Tetanus, diphtheria, and pertussis (Tdap) booster vaccine.  Influenza every year before the flu season begins.  Pneumonia vaccine.  Shingles vaccine.  Your health care provider may also recommend other immunizations. This information is not intended to replace advice given to you by your health care provider. Make sure you discuss any questions you have with your health care provider. Document Released: 11/06/2005  Document Revised: 04/03/2016 Document Reviewed: 06/18/2015 Elsevier Interactive Patient Education  2018 [employer] Inc.  

## 2017-09-14 NOTE — Progress Notes (Signed)
Jackie Jackson [birth year] [id number]    History:    Presents for breast and pelvic exam. Normal Pap and mammogram history. Osteopenia managed through primary care. Has never had a colonoscopy did have a negative cologard.  Current on vaccines. History of squamous cell carcinoma has annual skin checks. Cataract surgery this past year doing well.   Past medical history, past surgical history, family history and social history were all reviewed and documented in the EPIC chart. Husband diabetes. Daughter national waterskiing Jackie Jackson  and [place]. Mother died age 71 lived independently  ROS:  A ROS was performed and pertinent positives and negatives are included.  Exam:  Vitals:   09/14/17 0955  BP: 140/78  Weight: 148 lb (67.1 kg)  Height: 5\' 2"  (1.575 m)   Body mass index is 27.07 kg/m.   General appearance:  Normal Thyroid:  Symmetrical, normal in size, without palpable masses or nodularity. Respiratory  Auscultation:  Clear without wheezing or rhonchi Cardiovascular  Auscultation:  Regular rate, without rubs, murmurs or gallops  Edema/varicosities:  Not grossly evident Abdominal  Soft,nontender, without masses, guarding or rebound.  Liver/spleen:  No organomegaly noted  Hernia:  None appreciated  Skin  Inspection:  Grossly normal   Breasts: Examined lying and sitting.     Right: Without masses, retractions, discharge or axillary adenopathy.     Left: Without masses, retractions, discharge or axillary adenopathy. Gentitourinary   Inguinal/mons:  Normal without inguinal adenopathy  External genitalia:  Normal  BUS/Urethra/Skene's glands:  Normal  Vagina:  Normal  Cervix:  Normal  Uterus:   normal in size, shape and contour.  Midline and mobile  Adnexa/parametria:     Rt: Without masses or tenderness.   Lt: Without masses or tenderness.  Anus and perineum: Normal  Digital rectal exam: Normal sphincter tone without palpated masses or  tenderness  Assessment/Plan:  71 y.o. [name] G2 P2 for breast and pelvic exam with no complaints.  Postmenopausal/no HRT/no bleeding Squamous skin cancer history annual skin checks dermatologist Hypercholesterolemia, Osteopenia primary care manages labs and meds  Plan: Home safety, fall prevention and importance of increasing weightbearing and balanced salt exercise reviewed. SBE's, continue annual screening mammogram, calcium rich diet, vitamin D 2000 daily encouraged. Labs-primary care.    [place], 1:35 PM 09/14/2017

## 2017-10-29 ENCOUNTER — Ambulatory Visit (INDEPENDENT_AMBULATORY_CARE_PROVIDER_SITE_OTHER): Payer: Medicare Other | Admitting: *Deleted

## 2017-10-29 DIAGNOSIS — Z23 Encounter for immunization: Secondary | ICD-10-CM

## 2017-10-29 NOTE — Addendum Note (Signed)
Addended by: Marin Olp on: 10/29/2017 11:26 AM   Modules accepted: Orders

## 2017-10-29 NOTE — Progress Notes (Signed)
Pt given Flu vaccine and Prevnar inj Tolerated well

## 2017-11-05 ENCOUNTER — Ambulatory Visit (INDEPENDENT_AMBULATORY_CARE_PROVIDER_SITE_OTHER): Payer: Medicare Other

## 2017-11-05 ENCOUNTER — Encounter: Payer: Self-pay | Admitting: Physician Assistant

## 2017-11-05 ENCOUNTER — Ambulatory Visit: Payer: Medicare Other | Admitting: Physician Assistant

## 2017-11-05 VITALS — BP 130/77 | HR 82 | Temp 96.7°F | Ht 62.0 in | Wt 149.2 lb

## 2017-11-05 DIAGNOSIS — K219 Gastro-esophageal reflux disease without esophagitis: Secondary | ICD-10-CM

## 2017-11-05 DIAGNOSIS — Z780 Asymptomatic menopausal state: Secondary | ICD-10-CM | POA: Diagnosis not present

## 2017-11-05 DIAGNOSIS — E7800 Pure hypercholesterolemia, unspecified: Secondary | ICD-10-CM | POA: Diagnosis not present

## 2017-11-05 DIAGNOSIS — J301 Allergic rhinitis due to pollen: Secondary | ICD-10-CM | POA: Diagnosis not present

## 2017-11-05 DIAGNOSIS — Z0000 Encounter for general adult medical examination without abnormal findings: Secondary | ICD-10-CM

## 2017-11-05 MED ORDER — LORATADINE 10 MG PO TABS: 10.0000 mg | ORAL_TABLET | Freq: Every day | ORAL | 3 refills | Status: DC | PRN

## 2017-11-05 MED ORDER — RANITIDINE HCL 150 MG PO TABS
ORAL_TABLET | ORAL | 3 refills | Status: DC
Start: 2017-11-05 — End: 2018-09-29

## 2017-11-05 MED ORDER — METOCLOPRAMIDE HCL 10 MG PO TABS: ORAL_TABLET | ORAL | 3 refills | Status: DC

## 2017-11-05 MED ORDER — ATORVASTATIN CALCIUM 10 MG PO TABS: ORAL_TABLET | ORAL | 3 refills | Status: DC

## 2017-11-05 NOTE — Progress Notes (Signed)
BP 130/77   Pulse 82   Temp (!) 96.7 F (35.9 C) (Oral)   Ht _0  (1.575 m)   Wt 149 lb 3.2 oz (67.7 kg)   LMP 08/04/1997   BMI 27.29 kg/m    Subjective:    Patient ID: Jackie Jackson, female    DOB: 06/22/1946, 72 y.o.   MRN: [id number]  HPI: Jackie Jackson is a 72 y.o. female presenting on 11/05/2017 for Follow-up and Medication Refill  She is here for lipid, allergic rhinitis, GERD.  She is doing well overall This patient comes in for annual well physical examination. All medications are reviewed today. There are no reports of any problems with the medications. All of the medical conditions are reviewed and updated.  Lab work is reviewed and will be ordered as medically necessary. There are no new problems reported with today's visit.  Patient reports doing well overall.   Relevant past medical, surgical, family and social history reviewed and updated as indicated. Allergies and medications reviewed and updated.  Past Medical History:  Diagnosis Date  . Acid reflux   . High cholesterol   . Osteopenia    2006 -1.4, 2009 -1.9, 2011 -1.9    Past Surgical History:  Procedure Laterality Date  . BREAST BIOPSY  2012  . CATARACT EXTRACTION W/PHACO Left 03/22/2017   Procedure: CATARACT EXTRACTION PHACO AND INTRAOCULAR LENS PLACEMENT (IOC);  Surgeon: Tonny Branch, MD;  Location: [facility];  Service: Ophthalmology;  Laterality: Left;  CDE:  8.65  . CATARACT EXTRACTION W/PHACO Right 04/05/2017   Procedure: CATARACT EXTRACTION PHACO AND INTRAOCULAR LENS PLACEMENT (IOC);  Surgeon: Tonny Branch, MD;  Location: [facility];  Service: Ophthalmology;  Laterality: Right;  CDE:  5.23   . SKIN CANCER DESTRUCTION  2012   rt. lower leg  . TUBAL LIGATION      Review of Systems  Constitutional: Negative.  Negative for activity change, fatigue and fever.  HENT: Negative.   Eyes: Negative.   Respiratory: Negative.  Negative for cough.   Cardiovascular: Negative.  Negative for chest pain.    Gastrointestinal: Negative.  Negative for abdominal pain.  Endocrine: Negative.   Genitourinary: Negative.  Negative for dysuria.  Musculoskeletal: Negative.   Skin: Negative.   Neurological: Negative.     Allergies as of 11/05/2017      Reactions   Pantoprazole Sodium Anaphylaxis, Swelling   Protonix-tongue swelling      Medication List        Accurate as of 11/05/17 12:11 PM. Always use your most recent med list.          acetaminophen 500 MG tablet Commonly known as:  TYLENOL Take 500 mg by mouth 2 (two) times daily as needed for mild pain or headache.   atorvastatin 10 MG tablet Commonly known as:  LIPITOR TAKE ONE (1) TABLET EACH DAY   CALCIUM 1000 + D PO Take 1 tablet by mouth daily.   EQL OMEGA 3 FISH OIL 1000 MG Caps Take 1,000 mg by mouth daily.   loratadine 10 MG tablet Commonly known as:  CLARITIN Take 1 tablet (10 mg total) by mouth daily as needed for allergies.   metoCLOPramide 10 MG tablet Commonly known as:  REGLAN TAKE ONE TABLET FOUR TIMES A DAY BEFORE MEALS AND AT BEDTIME   ranitidine 150 MG tablet Commonly known as:  ZANTAC TAKE 1 OR 2 TABLETS DAILY   vitamin B-12 500 MCG tablet Commonly known as:  CYANOCOBALAMIN Take 500 mcg  by mouth daily.          Objective:    BP 130/77   Pulse 82   Temp (!) 96.7 F (35.9 C) (Oral)   Ht _0  (1.575 m)   Wt 149 lb 3.2 oz (67.7 kg)   LMP 08/04/1997   BMI 27.29 kg/m   Allergies  Allergen Reactions  . Pantoprazole Sodium Anaphylaxis and Swelling    Protonix-tongue swelling    Physical Exam  Constitutional: She is oriented to person, place, and time. She appears well-developed and well-nourished.  HENT:  Head: Normocephalic and atraumatic.  Right Ear: Tympanic membrane, external ear and ear canal normal.  Left Ear: Tympanic membrane, external ear and ear canal normal.  Nose: Nose normal. No rhinorrhea.  Mouth/Throat: Oropharynx is clear and moist and mucous membranes are normal. No  oropharyngeal exudate or posterior oropharyngeal erythema.  Eyes: Conjunctivae and EOM are normal. Pupils are equal, round, and reactive to light.  Neck: Normal range of motion. Neck supple.  Cardiovascular: Normal rate, regular rhythm, normal heart sounds and intact distal pulses.  Pulmonary/Chest: Effort normal and breath sounds normal.  Abdominal: Soft. Bowel sounds are normal.  Neurological: She is alert and oriented to person, place, and time. She has normal reflexes.  Skin: Skin is warm and dry. No rash noted.  Psychiatric: She has a normal mood and affect. Her behavior is normal. Judgment and thought content normal.        Assessment & Plan:   1. Pure hypercholesterolemia - atorvastatin (LIPITOR) 10 MG tablet; TAKE ONE (1) TABLET EACH DAY  Dispense: 90 tablet; Refill: 3 - CBC with Differential/Platelet - CMP14+EGFR - Lipid panel - TSH  2. Chronic seasonal allergic rhinitis due to pollen - loratadine (CLARITIN) 10 MG tablet; Take 1 tablet (10 mg total) by mouth daily as needed for allergies.  Dispense: 90 tablet; Refill: 3  3. Gastroesophageal reflux disease without esophagitis - metoCLOPramide (REGLAN) 10 MG tablet; TAKE ONE TABLET FOUR TIMES A DAY BEFORE MEALS AND AT BEDTIME  Dispense: 360 tablet; Refill: 3 - ranitidine (ZANTAC) 150 MG tablet; TAKE 1 OR 2 TABLETS DAILY  Dispense: 180 tablet; Refill: 3  4. Well adult exam - CBC with Differential/Platelet - CMP14+EGFR - Lipid panel - TSH - DG [facility] DEXA  5. Post-menopausal - DG [facility] DEXA    Current Outpatient Medications:  .  acetaminophen (TYLENOL) 500 MG tablet, Take 500 mg by mouth 2 (two) times daily as needed for mild pain or headache. , Disp: , Rfl:  .  atorvastatin (LIPITOR) 10 MG tablet, TAKE ONE (1) TABLET EACH DAY, Disp: 90 tablet, Rfl: 3 .  Calcium Carb-Cholecalciferol (CALCIUM 1000 + D PO), Take 1 tablet by mouth daily., Disp: , Rfl:  .  loratadine (CLARITIN) 10 MG tablet, Take 1 tablet (10 mg total) by  mouth daily as needed for allergies., Disp: 90 tablet, Rfl: 3 .  metoCLOPramide (REGLAN) 10 MG tablet, TAKE ONE TABLET FOUR TIMES A DAY BEFORE MEALS AND AT BEDTIME, Disp: 360 tablet, Rfl: 3 .  Omega-3 Fatty Acids (EQL OMEGA 3 FISH OIL) 1000 MG CAPS, Take 1,000 mg by mouth daily., Disp: , Rfl:  .  ranitidine (ZANTAC) 150 MG tablet, TAKE 1 OR 2 TABLETS DAILY, Disp: 180 tablet, Rfl: 3 .  vitamin B-12 (CYANOCOBALAMIN) 500 MCG tablet, Take 500 mcg by mouth daily.  , Disp: , Rfl:  Continue all other maintenance medications as listed above.  Follow up plan: Return in about 1 year (around 11/05/2018)  for recheck.  Educational handout given for [place] PA-C [place] [address]  [place], [place] [address] [phone]   11/05/2017, 12:11 PM

## 2017-11-06 LAB — CBC WITH DIFFERENTIAL/PLATELET
BASOS ABS: 0.1 10*3/uL (ref 0.0–0.2)
Basos: 1 %
EOS (ABSOLUTE): 0.1 10*3/uL (ref 0.0–0.4)
Eos: 1 %
Hematocrit: 39.3 % (ref 34.0–46.6)
Hemoglobin: 12.7 g/dL (ref 11.1–15.9)
IMMATURE GRANS (ABS): 0 10*3/uL (ref 0.0–0.1)
Immature Granulocytes: 0 %
LYMPHS: 42 %
Lymphocytes Absolute: 2 10*3/uL (ref 0.7–3.1)
MCH: 29.7 pg (ref 26.6–33.0)
MCHC: 32.3 g/dL (ref 31.5–35.7)
MCV: 92 fL (ref 79–97)
Monocytes Absolute: 0.4 10*3/uL (ref 0.1–0.9)
Monocytes: 7 %
NEUTROS ABS: 2.3 10*3/uL (ref 1.4–7.0)
Neutrophils: 49 %
PLATELETS: 258 10*3/uL (ref 150–379)
RBC: 4.28 x10E6/uL (ref 3.77–5.28)
RDW: 14.1 % (ref 12.3–15.4)
WBC: 4.7 10*3/uL (ref 3.4–10.8)

## 2017-11-06 LAB — CMP14+EGFR
ALT: 17 IU/L (ref 0–32)
AST: 17 IU/L (ref 0–40)
Albumin/Globulin Ratio: 2.1 (ref 1.2–2.2)
Albumin: 4.5 g/dL (ref 3.5–4.8)
Alkaline Phosphatase: 85 IU/L (ref 39–117)
BILIRUBIN TOTAL: 0.3 mg/dL (ref 0.0–1.2)
BUN/Creatinine Ratio: 11 — ABNORMAL LOW (ref 12–28)
BUN: 9 mg/dL (ref 8–27)
CHLORIDE: 108 mmol/L — AB (ref 96–106)
CO2: 21 mmol/L (ref 20–29)
Calcium: 9.4 mg/dL (ref 8.7–10.3)
Creatinine, Ser: 0.85 mg/dL (ref 0.57–1.00)
GFR calc Af Amer: 80 mL/min/{1.73_m2} (ref >59)
GFR calc non Af Amer: 69 mL/min/{1.73_m2} (ref >59)
GLUCOSE: 92 mg/dL (ref 65–99)
Globulin, Total: 2.1 g/dL (ref 1.5–4.5)
Potassium: 4.4 mmol/L (ref 3.5–5.2)
Sodium: 145 mmol/L — ABNORMAL HIGH (ref 134–144)
TOTAL PROTEIN: 6.6 g/dL (ref 6.0–8.5)

## 2017-11-06 LAB — TSH: TSH: 2.25 u[IU]/mL (ref 0.450–4.500)

## 2017-11-06 LAB — LIPID PANEL
Chol/HDL Ratio: 3.4 ratio (ref 0.0–4.4)
Cholesterol, Total: 201 mg/dL — ABNORMAL HIGH (ref 100–199)
HDL: 60 mg/dL (ref >39)
LDL Calculated: 123 mg/dL — ABNORMAL HIGH (ref 0–99)
TRIGLYCERIDES: 88 mg/dL (ref 0–149)
VLDL CHOLESTEROL CAL: 18 mg/dL (ref 5–40)

## 2017-11-09 ENCOUNTER — Other Ambulatory Visit: Payer: Self-pay | Admitting: Women's Health

## 2017-11-09 DIAGNOSIS — Z1231 Encounter for screening mammogram for malignant neoplasm of breast: Secondary | ICD-10-CM

## 2017-12-16 ENCOUNTER — Ambulatory Visit
Admission: RE | Admit: 2017-12-16 | Discharge: 2017-12-16 | Disposition: A | Discharge status: Home / Self Care | Payer: Medicare Other | Source: Ambulatory Visit | Attending: Women's Health | Admitting: Women's Health

## 2017-12-16 DIAGNOSIS — Z1231 Encounter for screening mammogram for malignant neoplasm of breast: Secondary | ICD-10-CM

## 2017-12-17 ENCOUNTER — Other Ambulatory Visit: Payer: Self-pay | Admitting: Women's Health

## 2017-12-17 DIAGNOSIS — R928 Other abnormal and inconclusive findings on diagnostic imaging of breast: Secondary | ICD-10-CM

## 2017-12-21 ENCOUNTER — Ambulatory Visit
Admission: RE | Admit: 2017-12-21 | Discharge: 2017-12-21 | Disposition: A | Discharge status: Home / Self Care | Payer: Medicare Other | Source: Ambulatory Visit | Attending: Women's Health | Admitting: Women's Health

## 2017-12-21 DIAGNOSIS — R928 Other abnormal and inconclusive findings on diagnostic imaging of breast: Secondary | ICD-10-CM

## 2018-04-19 ENCOUNTER — Ambulatory Visit (INDEPENDENT_AMBULATORY_CARE_PROVIDER_SITE_OTHER): Payer: Medicare Other | Admitting: *Deleted

## 2018-04-19 VITALS — BP 142/77 | HR 75 | Temp 97.4°F | Ht 62.0 in | Wt 151.0 lb

## 2018-04-19 DIAGNOSIS — Z0000 Encounter for general adult medical examination without abnormal findings: Secondary | ICD-10-CM | POA: Diagnosis not present

## 2018-04-19 NOTE — Patient Instructions (Signed)
  Jackie Jackson , Thank you for taking time to come for your Medicare Wellness Visit. I appreciate your ongoing commitment to your health goals. Please review the following plan we discussed and let me know if I can assist you in the future.   These are the goals we discussed: Goals    . Prevent falls     Stay active     . Weight (lb) < 125 lb (56.7 kg)       This is a list of the screening recommended for you and due dates:  Health Maintenance  Topic Date Due  .  Hepatitis C: One time screening is recommended by Center for Disease Control  (CDC) for  adults born from [birth year] through 1965.   1946-06-16  . Tetanus Vaccine  03/06/1965  . Colon Cancer Screening  03/06/1996  . Flu Shot  04/28/2018  . Pneumonia vaccines (2 of 2 - PPSV23) 10/29/2018  . Mammogram  12/22/2019  . DEXA scan (bone density measurement)  Completed    Keep yearly follow up with Particia Nearing, PA We will plan to give you a Pneumovax 23 at your next visit. schedule your eye exam

## 2018-04-19 NOTE — Progress Notes (Addendum)
Subjective:   Jackie Jackson is a 72 y.o. female who presents for Medicare Annual (Subsequent) preventive examination. 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 She enjoys Research officer, political party and socializing with her friends. She walks about 5 days a week for exercise. She states that her diet is semi-healthy and she generally gets in 2 meals a day. She is active in her church. Sh lives at home with her husband, Jackie Jackson. They have 2 children and no grandchildren at this time. She does not have any pets and fall risks were discussed today. She states that her health is about the same as a year ago.   Cardiac Risk Factors include: advanced age (>9men, >82 women);dyslipidemia     Objective:     Vitals: BP (!) 142/77   Pulse 75   Temp (!) 97.4 F (36.3 C) (Oral)   Ht 5\' 2"  (1.575 m)   Wt 151 lb (68.5 kg)   LMP 08/04/1997   BMI 27.62 kg/m   Body mass index is 27.62 kg/m.  Advanced Directives 04/19/2018 03/22/2017 03/12/2017  Does Patient Have a Medical Advance Directive? No Yes Yes  Type of Advance Directive - - [place] in Chart? - No - copy requested No - copy requested  Would patient like information on creating a medical advance directive? Yes (MAU/Ambulatory/Procedural Areas - Information given);No - Patient declined - -    Tobacco Social History   Tobacco Use  Smoking Status Never Smoker  Smokeless Tobacco Never Used     Counseling given: Not Answered   Past Medical History:  Diagnosis Date  . Acid reflux   . High cholesterol   . Osteopenia    2006 -1.4, 2009 -1.9, 2011 -1.9   Past Surgical History:  Procedure Laterality Date  . BREAST BIOPSY  2012  . CATARACT EXTRACTION W/PHACO Left 03/22/2017   Procedure: CATARACT EXTRACTION PHACO AND INTRAOCULAR LENS PLACEMENT (IOC);  Surgeon: Tonny Branch, MD;  Location: [facility];  Service: Ophthalmology;  Laterality: Left;  CDE:  8.65  .  CATARACT EXTRACTION W/PHACO Right 04/05/2017   Procedure: CATARACT EXTRACTION PHACO AND INTRAOCULAR LENS PLACEMENT (IOC);  Surgeon: Tonny Branch, MD;  Location: [facility];  Service: Ophthalmology;  Laterality: Right;  CDE:  5.23   . SKIN CANCER DESTRUCTION  2012   rt. lower leg  . TUBAL LIGATION     Family History  Problem Relation Age of Onset  . Diabetes Brother   . Heart disease Father   . Heart disease Mother   . Cancer Sister        lung  . Healthy Daughter   . Healthy Son   . Heart disease Maternal Grandmother    Social History   Socioeconomic History  . Marital status: Married    Spouse name: Jackie Jackson  . Number of children: 2  . Years of education: Not on file  . Highest education level: Not on file  Occupational History  . 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 Financial resource strain: Not on file  . Food insecurity:    Worry: Not on file    Inability: Not on file  . Transportation needs:    Medical: Not on file    Non-medical: Not on file  Tobacco Use  . Smoking status: Never Smoker  . Smokeless tobacco:  Never Used  Substance and Sexual Activity  . Alcohol use: No  . Drug use: No  . Sexual activity: Never  Lifestyle  . Physical activity:    Days per week: Not on file    Minutes per session: Not on file  . Stress: Not on file  Relationships  . Social connections:    Talks on phone: Not on file    Gets together: Not on file    Attends religious service: Not on file    Active member of club or organization: Not on file    Attends meetings of clubs or organizations: Not on file    Relationship status: Not on file  Other Topics Concern  . Not on file  Social History Narrative  . Not on file    Outpatient Encounter Medications as of 04/19/2018  Medication Sig  . acetaminophen (TYLENOL) 500 MG tablet Take 500 mg by mouth 2 (two) times daily as needed for mild pain or headache.   Marland Kitchen atorvastatin (LIPITOR) 10 MG  tablet TAKE ONE (1) TABLET EACH DAY  . Calcium Carb-Cholecalciferol (CALCIUM 1000 + D PO) Take 1 tablet by mouth daily.  Marland Kitchen loratadine (CLARITIN) 10 MG tablet Take 1 tablet (10 mg total) by mouth daily as needed for allergies.  Marland Kitchen metoCLOPramide (REGLAN) 10 MG tablet TAKE ONE TABLET FOUR TIMES A DAY BEFORE MEALS AND AT BEDTIME  . Multiple Vitamin (MULTIVITAMIN WITH MINERALS) TABS tablet Take 1 tablet by mouth daily.  . Omega-3 Fatty Acids (EQL OMEGA 3 FISH OIL) 1000 MG CAPS Take 1,000 mg by mouth daily.  . ranitidine (ZANTAC) 150 MG tablet TAKE 1 OR 2 TABLETS DAILY  . vitamin B-12 (CYANOCOBALAMIN) 500 MCG tablet Take 500 mcg by mouth daily.     No facility-administered encounter medications on file as of 04/19/2018.     Activities of Daily Living In your present state of health, do you have any difficulty performing the following activities: 04/19/2018  Hearing? N  Vision? N  Difficulty concentrating or making decisions? N  Walking or climbing stairs? N  Dressing or bathing? N  Doing errands, shopping? N  Preparing Food and eating ? N  Using the Toilet? N  In the past six months, have you accidently leaked urine? N  Do you have problems with loss of bowel control? N  Managing your Medications? N  Managing your Finances? N  Housekeeping or managing your Housekeeping? N  Some recent data might be hidden    Patient Care Team: Theodoro Clock as PCP - General (Physician Assistant) Huel Cote, NP as Nurse Practitioner (Obstetrics and Gynecology) Okey Regal, OD (Optometry)    Assessment:   This is a routine wellness examination for [facility].  Exercise Activities and Dietary recommendations Current Exercise Habits: Home exercise routine, Type of exercise: walking;Other - see comments(kayak ), Time (Minutes): 30, Frequency (Times/Week): 5, Weekly Exercise (Minutes/Week): 150, Intensity: Mild  Goals    . Prevent falls     Stay active     . Weight (lb) < 125 lb (56.7 kg)         Fall Risk Fall Risk  04/19/2018 11/05/2017 08/26/2016  Falls in the past year? No No No   Is the patient's home free of loose throw rugs in walkways, pet beds, electrical cords, etc?   Fall risks and hazards were discussed today  Depression Screen PHQ 2/9 Scores 04/19/2018 11/05/2017 08/26/2016  PHQ - 2 Score 0 0 0     Cognitive  Function MMSE - Mini Mental State Exam 04/19/2018  Orientation to time 5  Orientation to Place 5  Registration 3  Attention/ Calculation 5  Recall 1  Language- name 2 objects 2  Language- repeat 1  Language- follow 3 step command 3  Language- read & follow direction 1  Write a sentence 1  Copy design 1  Total score 28        Immunization History  Administered Date(s) Administered  . Influenza, High Dose Seasonal PF 08/26/2016, 10/29/2017  . Pneumococcal Conjugate-13 10/29/2017    Qualifies for Shingles Vaccine?pt states that she has had this  Screening Tests Health Maintenance  Topic Date Due  . Hepatitis C Screening  12-Oct-1945  . TETANUS/TDAP  03/06/1965  . COLONOSCOPY  03/06/1996  . INFLUENZA VACCINE  04/28/2018  . PNA vac Low Risk Adult (2 of 2 - PPSV23) 10/29/2018  . MAMMOGRAM  12/22/2019  . DEXA SCAN  Completed    Cancer Screenings: Lung: Low Dose CT Chest recommended if Age 28-80 years, 30 pack-year currently smoking OR have quit w/in 15years. Patient does not qualify. Breast:  Up to date on Mammogram? Yes   Up to date of Bone Density/Dexa? Yes Colorectal: due - will do cologaurd  Additional Screenings: declined  Hepatitis C Screening:      Plan:   pt is to keep yearly follow up with Particia Nearing, PA She is to schedule a eye exam  She will need a pneumovax 23 at her Feb 2020 OV. She is also due for a Td and declines today due to cost.  I have personally reviewed and noted the following in the patient's chart:   . Medical and social history . Use of alcohol, tobacco or illicit drugs  . Current medications and  supplements . Functional ability and status . Nutritional status . Physical activity . Advanced directives . List of other physicians . Hospitalizations, surgeries, and ER visits in previous 12 months . Vitals . Screenings to include cognitive, depression, and falls . Referrals and appointments  In addition, I have reviewed and discussed with patient certain preventive protocols, quality metrics, and best practice recommendations. A written personalized care plan for preventive services as well as general preventive health recommendations were provided to patient.     Davonta Stroot, Cameron Proud, LPN  0/98/1191  I have reviewed and agree with the above AWV documentation.   Terald Sleeper PA-C [place] [address]  [place], [place] [address] [phone]

## 2018-09-27 ENCOUNTER — Encounter: Payer: Self-pay | Admitting: Women's Health

## 2018-09-27 ENCOUNTER — Ambulatory Visit: Payer: Medicare Other | Admitting: Women's Health

## 2018-09-27 VITALS — BP 140/82 | Ht 62.0 in | Wt 149.0 lb

## 2018-09-27 DIAGNOSIS — Z01419 Encounter for gynecological examination (general) (routine) without abnormal findings: Secondary | ICD-10-CM | POA: Diagnosis not present

## 2018-09-27 NOTE — Progress Notes (Signed)
Jackie Jackson 1945/12/13 [id number]    History:    Presents for breast and pelvic exam.  Postmenopausal/no HRT/no bleeding.  Normal Pap and mammogram history.  History of squamous skin cancer overdue for annual exam.  Primary care manages hypercholesteremia, DEXA, GERD.  03/2017 cataracts.  Has had first Shingrex is due for Pneumovax booster 10/2018.  Colonoscopy current.  Not sexually active, husband's health.  Past medical history, past surgical history, family history and social history were all reviewed and documented in the EPIC chart.  Jackie Jackson slalom waterski champion 2019, third in the world 2018.  ROS:  A ROS was performed and pertinent positives and negatives are included.  Exam:  Vitals:   09/27/18 1037  BP: 140/82  Weight: 149 lb (67.6 kg)  Height: 5\' 2"  (1.575 m)   Body mass index is 27.25 kg/m.   General appearance:  Normal Thyroid:  Symmetrical, normal in size, without palpable masses or nodularity. Respiratory  Auscultation:  Clear without wheezing or rhonchi Cardiovascular  Auscultation:  Regular rate, without rubs, murmurs or gallops  Edema/varicosities:  Not grossly evident Abdominal  Soft,nontender, without masses, guarding or rebound.  Liver/spleen:  No organomegaly noted  Hernia:  None appreciated  Skin  Inspection:  Grossly normal   Breasts: Examined lying and sitting.     Right: Without masses, retractions, discharge or axillary adenopathy.     Left: Without masses, retractions, discharge or axillary adenopathy. Gentitourinary   Inguinal/mons:  Normal without inguinal adenopathy  External genitalia:  Normal  BUS/Urethra/Skene's glands:  Normal  Vagina:  Normal  Cervix:  Normal  Uterus:   normal in size, shape and contour.  Midline and mobile  Adnexa/parametria:     Rt: Without masses or tenderness.   Lt: Without masses or tenderness.  Anus and perineum: Normal  Digital rectal exam: Normal sphincter tone without palpated masses or  tenderness  Assessment/Plan:  72 y.o. [name] G2, P2 for breast and pelvic exam with no complaints.  Postmenopausal/no HRT/no bleeding Hypercholesteremia, GERD, DEXA-primary care manages labs and meds Squamous skin cancer  Plan: Reviewed importance of annual skin check, strongly encouraged to schedule.  Continue sunscreens and cover [place]  SBEs, continue annual 3D screening mammogram, calcium rich foods, vitamin D 2000 daily.  Home safety, fall prevention and importance of weightbearing and balance type exercise reviewed and encouraged.      [place], 1:14 PM 09/27/2018

## 2018-09-27 NOTE — Patient Instructions (Signed)
Health Maintenance After Age 72 After age 72, you are at a higher risk for certain long-term diseases and infections as well as injuries from falls. Falls are a major cause of broken bones and head injuries in people who are older than age 72. Getting regular preventive care can help to keep you healthy and well. Preventive care includes getting regular testing and making lifestyle changes as recommended by your health care provider. Talk with your health care provider about:  Which screenings and tests you should have. A screening is a test that checks for a disease when you have no symptoms.  A diet and exercise plan that is right for you. What should I know about screenings and tests to prevent falls? Screening and testing are the best ways to find a health problem early. Early diagnosis and treatment give you the best chance of managing medical conditions that are common after age 72. Certain conditions and lifestyle choices may make you more likely to have a fall. Your health care provider may recommend:  Regular vision checks. Poor vision and conditions such as cataracts can make you more likely to have a fall. If you wear glasses, make sure to get your prescription updated if your vision changes.  Medicine review. Work with your health care provider to regularly review all of the medicines you are taking, including over-the-counter medicines. Ask your health care provider about any side effects that may make you more likely to have a fall. Tell your health care provider if any medicines that you take make you feel dizzy or sleepy.  Osteoporosis screening. Osteoporosis is a condition that causes the bones to get weaker. This can make the bones weak and cause them to break more easily.  Blood pressure screening. Blood pressure changes and medicines to control blood pressure can make you feel dizzy.  Strength and balance checks. Your health care provider may recommend certain tests to check your  strength and balance while standing, walking, or changing positions.  Foot health exam. Foot pain and numbness, as well as not wearing proper footwear, can make you more likely to have a fall.  Depression screening. You may be more likely to have a fall if you have a fear of falling, feel emotionally low, or feel unable to do activities that you used to do.  Alcohol use screening. Using too much alcohol can affect your balance and may make you more likely to have a fall. What actions can I take to lower my risk of falls? General instructions  Talk with your health care provider about your risks for falling. Tell your health care provider if: ? You fall. Be sure to tell your health care provider about all falls, even ones that seem minor. ? You feel dizzy, sleepy, or off-balance.  Take over-the-counter and prescription medicines only as told by your health care provider. These include any supplements.  Eat a healthy diet and maintain a healthy weight. A healthy diet includes low-fat dairy products, low-fat (lean) meats, and fiber from whole grains, beans, and lots of fruits and vegetables. Home safety  Remove any tripping hazards, such as rugs, cords, and clutter.  Install safety equipment such as grab bars in bathrooms and safety rails on stairs.  Keep rooms and walkways well-lit. Activity   Follow a regular exercise program to stay fit. This will help you maintain your balance. Ask your health care provider what types of exercise are appropriate for you.  If you need a cane or   walker, use it as recommended by your health care provider.  Wear supportive shoes that have nonskid soles. Lifestyle  Do not drink alcohol if your health care provider tells you not to drink.  If you drink alcohol, limit how much you have: ? 0-1 drink a day for women. ? 0-2 drinks a day for men.  Be aware of how much alcohol is in your drink. In the [place] one drink equals one typical bottle of beer (12  oz), one-half glass of wine (5 oz), or one shot of hard liquor (1 oz).  Do not use any products that contain nicotine or tobacco, such as cigarettes and e-cigarettes. If you need help quitting, ask your health care provider. Summary  Having a healthy lifestyle and getting preventive care can help to protect your health and wellness after age 72.  Screening and testing are the best way to find a health problem early and help you avoid having a fall. Early diagnosis and treatment give you the best chance for managing medical conditions that are more common for people who are older than age 72.  Falls are a major cause of broken bones and head injuries in people who are older than age 72. Take precautions to prevent a fall at home.  Work with your health care provider to learn what changes you can make to improve your health and wellness and to prevent falls. This information is not intended to replace advice given to you by your health care provider. Make sure you discuss any questions you have with your health care provider. Document Released: 07/28/2017 Document Revised: 07/28/2017 Document Reviewed: 07/28/2017 Elsevier Interactive Patient Education  2019 [employer]  

## 2018-09-28 LAB — URINALYSIS, COMPLETE W/RFL CULTURE
BILIRUBIN URINE: NEGATIVE
Bacteria, UA: NONE SEEN /HPF
Glucose, UA: NEGATIVE
Hgb urine dipstick: NEGATIVE
Hyaline Cast: NONE SEEN /LPF
KETONES UR: NEGATIVE
LEUKOCYTE ESTERASE: NEGATIVE
NITRITES URINE, INITIAL: NEGATIVE
Protein, ur: NEGATIVE
RBC / HPF: NONE SEEN /HPF (ref 0–2)
SPECIFIC GRAVITY, URINE: 1.005 (ref 1.001–1.03)
SQUAMOUS EPITHELIAL / LPF: NONE SEEN /HPF (ref <=5)
WBC, UA: NONE SEEN /HPF (ref 0–5)
pH: 6.5 (ref 5.0–8.0)

## 2018-09-28 LAB — NO CULTURE INDICATED

## 2018-09-29 ENCOUNTER — Encounter: Payer: Self-pay | Admitting: Physician Assistant

## 2018-09-29 ENCOUNTER — Ambulatory Visit: Payer: Medicare Other | Admitting: Physician Assistant

## 2018-09-29 VITALS — BP 135/79 | HR 73 | Temp 97.8°F | Ht 62.0 in | Wt 151.0 lb

## 2018-09-29 DIAGNOSIS — K219 Gastro-esophageal reflux disease without esophagitis: Secondary | ICD-10-CM

## 2018-09-29 DIAGNOSIS — E7800 Pure hypercholesterolemia, unspecified: Secondary | ICD-10-CM | POA: Diagnosis not present

## 2018-09-29 DIAGNOSIS — J301 Allergic rhinitis due to pollen: Secondary | ICD-10-CM | POA: Diagnosis not present

## 2018-09-29 DIAGNOSIS — Z0001 Encounter for general adult medical examination with abnormal findings: Secondary | ICD-10-CM

## 2018-09-29 DIAGNOSIS — M8580 Other specified disorders of bone density and structure, unspecified site: Secondary | ICD-10-CM

## 2018-09-29 DIAGNOSIS — Z0000 Encounter for general adult medical examination without abnormal findings: Secondary | ICD-10-CM

## 2018-09-29 MED ORDER — LORATADINE 10 MG PO TABS: 10.0000 mg | ORAL_TABLET | Freq: Every day | ORAL | 3 refills | Status: DC | PRN

## 2018-09-29 MED ORDER — METOCLOPRAMIDE HCL 10 MG PO TABS: ORAL_TABLET | ORAL | 3 refills | Status: DC

## 2018-09-29 MED ORDER — FAMOTIDINE 20 MG PO TABS: 20.0000 mg | ORAL_TABLET | Freq: Two times a day (BID) | ORAL | 3 refills | Status: DC

## 2018-09-29 MED ORDER — ATORVASTATIN CALCIUM 10 MG PO TABS: ORAL_TABLET | ORAL | 3 refills | Status: DC

## 2018-09-29 NOTE — Progress Notes (Signed)
BP 135/79   Pulse 73   Temp 97.8 F (36.6 C) (Oral)   Ht '5\' 2"'$  (1.575 m)   Wt 151 lb (68.5 kg)   LMP 08/04/1997   BMI 27.62 kg/m    Subjective:    Patient ID: Jackie Jackson, female    DOB: [birth year], 73 y.o.   MRN: [id number]  HPI: Jackie Jackson is a 73 y.o. female presenting on 09/29/2018 for Hyperlipidemia This patient comes in for an annual physical exam.  She overall is doing very well.  She does have a history of osteopenia, hyperlipidemia, allergic rhinitis, GERD.  We are going to change her Zantac due to the medication recall.  We will change over to Pepcid.  She has an intolerance to the proton pump inhibitors.  Overall she states she is doing very well.  She does feel somewhat stiff and sore a lot of days.  Have encouraged her to get into a stretching or yoga type exercise routine and to continue walking due to her osteopenia.  She is not due until next year for another DEXA scan.  She still goes to gynecology for her female exams and mammography.   Past Medical History:  Diagnosis Date  . Acid reflux   . High cholesterol   . Osteopenia    2006 -1.4, 2009 -1.9, 2011 -1.9   Relevant past medical, surgical, family and social history reviewed and updated as indicated. Interim medical history since our last visit reviewed. Allergies and medications reviewed and updated. DATA REVIEWED: CHART IN EPIC  Family History reviewed for pertinent findings.  Review of Systems  Constitutional: Negative.  Negative for activity change, fatigue and fever.  HENT: Negative.   Eyes: Negative.   Respiratory: Negative.  Negative for cough.   Cardiovascular: Negative.  Negative for chest pain.  Gastrointestinal: Negative.  Negative for abdominal pain.  Endocrine: Negative.   Genitourinary: Negative.  Negative for dysuria.  Musculoskeletal: Negative.   Skin: Negative.   Neurological: Negative.     Allergies as of 09/29/2018      Reactions   Pantoprazole Sodium Anaphylaxis, Swelling     Protonix-tongue swelling      Medication List       Accurate as of September 29, 2018  3:27 PM. Always use your most recent med list.        acetaminophen 500 MG tablet Commonly known as:  TYLENOL Take 500 mg by mouth 2 (two) times daily as needed for mild pain or headache.   atorvastatin 10 MG tablet Commonly known as:  LIPITOR TAKE ONE (1) TABLET EACH DAY   CALCIUM 1000 + D PO Take 1 tablet by mouth daily.   famotidine 20 MG tablet Commonly known as:  PEPCID Take 1 tablet (20 mg total) by mouth 2 (two) times daily.   loratadine 10 MG tablet Commonly known as:  CLARITIN Take 1 tablet (10 mg total) by mouth daily as needed for allergies.   metoCLOPramide 10 MG tablet Commonly known as:  REGLAN TAKE ONE TABLET FOUR TIMES A DAY BEFORE MEALS AND AT BEDTIME   multivitamin with minerals Tabs tablet Take 1 tablet by mouth daily.          Objective:    BP 135/79   Pulse 73   Temp 97.8 F (36.6 C) (Oral)   Ht '5\' 2"'$  (1.575 m)   Wt 151 lb (68.5 kg)   LMP 08/04/1997   BMI 27.62 kg/m   Allergies  Allergen Reactions  .  Pantoprazole Sodium Anaphylaxis and Swelling    Protonix-tongue swelling    Wt Readings from Last 3 Encounters:  09/29/18 151 lb (68.5 kg)  09/27/18 149 lb (67.6 kg)  04/19/18 151 lb (68.5 kg)    Physical Exam Constitutional:      Appearance: She is well-developed.  HENT:     Head: Normocephalic and atraumatic.     Right Ear: Tympanic membrane, ear canal and external ear normal.     Left Ear: Tympanic membrane, ear canal and external ear normal.     Nose: Nose normal. No rhinorrhea.     Mouth/Throat:     Pharynx: No oropharyngeal exudate or posterior oropharyngeal erythema.  Eyes:     Conjunctiva/sclera: Conjunctivae normal.     Pupils: Pupils are equal, round, and reactive to light.  Neck:     Musculoskeletal: Normal range of motion and neck supple.  Cardiovascular:     Rate and Rhythm: Normal rate and regular rhythm.     Heart sounds:  Normal heart sounds.  Pulmonary:     Effort: Pulmonary effort is normal.     Breath sounds: Normal breath sounds.  Abdominal:     General: Bowel sounds are normal.     Palpations: Abdomen is soft.  Skin:    General: Skin is warm and dry.     Findings: No rash.  Neurological:     Mental Status: She is alert and oriented to person, place, and time.     Deep Tendon Reflexes: Reflexes are normal and symmetric.  Psychiatric:        Behavior: Behavior normal.        Thought Content: Thought content normal.        Judgment: Judgment normal.     Results for orders placed or performed in visit on 09/27/18  Urinalysis,Complete w/RFL Culture  Result Value Ref Range   Color, Urine YELLOW YELLOW   APPearance CLEAR CLEAR   Specific Gravity, Urine 1.005 1.001 - 1.03   pH 6.5 5.0 - 8.0   Glucose, UA NEGATIVE NEGATIVE   Bilirubin Urine NEGATIVE NEGATIVE   Ketones, ur NEGATIVE NEGATIVE   Hgb urine dipstick NEGATIVE NEGATIVE   Protein, ur NEGATIVE NEGATIVE   Nitrites, Initial NEGATIVE NEGATIVE   Leukocyte Esterase NEGATIVE NEGATIVE   WBC, UA NONE SEEN 0 - 5 /HPF   RBC / HPF NONE SEEN 0 - 2 /HPF   Squamous Epithelial / LPF NONE SEEN < OR = 5 /HPF   Bacteria, UA NONE SEEN NONE SEEN /HPF   Hyaline Cast NONE SEEN NONE SEEN /LPF  REFLEXIVE URINE CULTURE  Result Value Ref Range   Reflexve Urine Culture NO CULTURE INDICATED       Assessment & Plan:   1. Well adult exam - CMP14+EGFR - CBC with Differential/Platelet - Lipid panel - TSH  2. Osteopenia, unspecified location Continue weightbearing exercise and stretching  3. Pure hypercholesterolemia - atorvastatin (LIPITOR) 10 MG tablet; TAKE ONE (1) TABLET EACH DAY  Dispense: 90 tablet; Refill: 3  4. Chronic seasonal allergic rhinitis due to pollen - loratadine (CLARITIN) 10 MG tablet; Take 1 tablet (10 mg total) by mouth daily as needed for allergies.  Dispense: 90 tablet; Refill: 3  5. Gastroesophageal reflux disease without  esophagitis - metoCLOPramide (REGLAN) 10 MG tablet; TAKE ONE TABLET FOUR TIMES A DAY BEFORE MEALS AND AT BEDTIME  Dispense: 360 tablet; Refill: 3 - famotidine (PEPCID) 20 MG tablet; Take 1 tablet (20 mg total) by mouth 2 (two) times  daily.  Dispense: 180 tablet; Refill: 3   Continue all other maintenance medications as listed above.  Follow up plan: Return in about 1 year (around 09/30/2019).  Educational handout given for [place] PA-C [facility] [address]  [place], [place] [address] [phone]   09/29/2018, 3:27 PM

## 2018-09-30 LAB — CMP14+EGFR
ALK PHOS: 83 IU/L (ref 39–117)
ALT: 20 IU/L (ref 0–32)
AST: 20 IU/L (ref 0–40)
Albumin/Globulin Ratio: 2.4 — ABNORMAL HIGH (ref 1.2–2.2)
Albumin: 4.7 g/dL (ref 3.5–4.8)
BUN/Creatinine Ratio: 16 (ref 12–28)
BUN: 15 mg/dL (ref 8–27)
Bilirubin Total: 0.4 mg/dL (ref 0.0–1.2)
CO2: 21 mmol/L (ref 20–29)
Calcium: 9.9 mg/dL (ref 8.7–10.3)
Chloride: 103 mmol/L (ref 96–106)
Creatinine, Ser: 0.91 mg/dL (ref 0.57–1.00)
GFR calc Af Amer: 73 mL/min/{1.73_m2} (ref >59)
GFR calc non Af Amer: 63 mL/min/{1.73_m2} (ref >59)
GLUCOSE: 86 mg/dL (ref 65–99)
Globulin, Total: 2 g/dL (ref 1.5–4.5)
Potassium: 4.4 mmol/L (ref 3.5–5.2)
Sodium: 141 mmol/L (ref 134–144)
Total Protein: 6.7 g/dL (ref 6.0–8.5)

## 2018-09-30 LAB — CBC WITH DIFFERENTIAL/PLATELET
Basophils Absolute: 0.1 10*3/uL (ref 0.0–0.2)
Basos: 1 %
EOS (ABSOLUTE): 0 10*3/uL (ref 0.0–0.4)
EOS: 1 %
HEMATOCRIT: 38 % (ref 34.0–46.6)
HEMOGLOBIN: 12.9 g/dL (ref 11.1–15.9)
IMMATURE GRANULOCYTES: 0 %
Immature Grans (Abs): 0 10*3/uL (ref 0.0–0.1)
LYMPHS: 38 %
Lymphocytes Absolute: 2.1 10*3/uL (ref 0.7–3.1)
MCH: 29.8 pg (ref 26.6–33.0)
MCHC: 33.9 g/dL (ref 31.5–35.7)
MCV: 88 fL (ref 79–97)
MONOCYTES: 7 %
Monocytes Absolute: 0.4 10*3/uL (ref 0.1–0.9)
NEUTROS PCT: 53 %
Neutrophils Absolute: 3 10*3/uL (ref 1.4–7.0)
Platelets: 274 10*3/uL (ref 150–450)
RBC: 4.33 x10E6/uL (ref 3.77–5.28)
RDW: 12.8 % (ref 12.3–15.4)
WBC: 5.6 10*3/uL (ref 3.4–10.8)

## 2018-09-30 LAB — LIPID PANEL
CHOL/HDL RATIO: 3.3 ratio (ref 0.0–4.4)
Cholesterol, Total: 222 mg/dL — ABNORMAL HIGH (ref 100–199)
HDL: 67 mg/dL (ref >39)
LDL CALC: 143 mg/dL — AB (ref 0–99)
TRIGLYCERIDES: 59 mg/dL (ref 0–149)
VLDL Cholesterol Cal: 12 mg/dL (ref 5–40)

## 2018-09-30 LAB — TSH: TSH: 1.56 u[IU]/mL (ref 0.450–4.500)

## 2018-11-10 ENCOUNTER — Other Ambulatory Visit: Payer: Self-pay | Admitting: Women's Health

## 2018-11-10 DIAGNOSIS — Z1231 Encounter for screening mammogram for malignant neoplasm of breast: Secondary | ICD-10-CM

## 2018-11-24 ENCOUNTER — Telehealth: Payer: Self-pay | Admitting: Physician Assistant

## 2018-11-25 ENCOUNTER — Encounter: Payer: Self-pay | Admitting: Family Medicine

## 2018-11-25 ENCOUNTER — Telehealth: Payer: Self-pay | Admitting: Physician Assistant

## 2018-11-25 ENCOUNTER — Ambulatory Visit: Payer: Medicare Other | Admitting: Family Medicine

## 2018-11-25 VITALS — BP 162/92 | HR 106 | Temp 96.8°F | Ht 62.0 in | Wt 148.0 lb

## 2018-11-25 DIAGNOSIS — K148 Other diseases of tongue: Secondary | ICD-10-CM | POA: Diagnosis not present

## 2018-11-25 DIAGNOSIS — E7800 Pure hypercholesterolemia, unspecified: Secondary | ICD-10-CM

## 2018-11-25 DIAGNOSIS — K219 Gastro-esophageal reflux disease without esophagitis: Secondary | ICD-10-CM | POA: Diagnosis not present

## 2018-11-25 DIAGNOSIS — T50905A Adverse effect of unspecified drugs, medicaments and biological substances, initial encounter: Secondary | ICD-10-CM | POA: Diagnosis not present

## 2018-11-25 MED ORDER — ROSUVASTATIN CALCIUM 20 MG PO TABS: 20.0000 mg | ORAL_TABLET | ORAL | 3 refills | Status: DC

## 2018-11-25 NOTE — Telephone Encounter (Signed)
appt scheduled Pt notified 

## 2018-11-25 NOTE — Progress Notes (Signed)
Subjective:  Patient ID: Jackie Jackson, female    DOB: 1946-02-16, 73 y.o.   MRN: [id number]  Chief Complaint:  Acid reflux, swelling and edema (has noticed swelling in feet, hands, jaw and tongue which she has read can be side effects of Pepcid) and Flicking of tongue (can be a side effect of Reglan)   HPI: Jackie Jackson is a 73 y.o. female presenting on 11/25/2018 for Acid reflux, swelling and edema (has noticed swelling in feet, hands, jaw and tongue which she has read can be side effects of Pepcid) and Flicking of tongue (can be a side effect of Reglan)  Pt presents today with daughter for possible adverse drug reactions. Pts daughter concerned about symptoms that may be due to medications. Daughter states she has noticed that her mother moves her tongue constantly. Pt states she has noticed an increase in her acid reflux symptoms. States she has burning in her chest and throat from her reflux. States this has increased over the last week and is worse at night. Pt states she has itching and swelling to the bottom of her feet and feels this is due to her Pepcid. States she has also had intermittent swelling of her tongue. States she does not have shortness of breath or choking with this swelling. She states she is concerned the Lipitor is causing the increased reflux and that the Pepcid is causing the pruritis and swelling. She feels the Reglan may be causing the abnormal tongue movements. She would like changes in medications to help with these side effects.  She has been on the Pepcid since January and just developed the pruritis and swelling.  She has been on the Reglan for several years and has noticed the abnormal tongue movements over the last year.  She has been on the Lipitor for a long time but has just noticed the increase in her reflux.   Relevant past medical, surgical, family, and social history reviewed and updated as indicated.  Allergies and medications reviewed and  updated.   Past Medical History:  Diagnosis Date  . Acid reflux   . High cholesterol   . Osteopenia    2006 -1.4, 2009 -1.9, 2011 -1.9    Past Surgical History:  Procedure Laterality Date  . BREAST BIOPSY  2012  . CATARACT EXTRACTION W/PHACO Left 03/22/2017   Procedure: CATARACT EXTRACTION PHACO AND INTRAOCULAR LENS PLACEMENT (IOC);  Surgeon: Tonny Branch, MD;  Location: [facility];  Service: Ophthalmology;  Laterality: Left;  CDE:  8.65  . CATARACT EXTRACTION W/PHACO Right 04/05/2017   Procedure: CATARACT EXTRACTION PHACO AND INTRAOCULAR LENS PLACEMENT (IOC);  Surgeon: Tonny Branch, MD;  Location: [facility];  Service: Ophthalmology;  Laterality: Right;  CDE:  5.23   . SKIN CANCER DESTRUCTION  2012   rt. lower leg  . TUBAL LIGATION      Social History   Socioeconomic History  . Marital status: Married    Spouse name: [name]  . Number of children: 2  . Years of education: Not on file  . Highest education level: Not on file  Occupational History  . 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 Financial resource strain: Not on file  . Food insecurity:    Worry: Not on file    Inability: Not on file  . Transportation needs:    Medical: Not on file    Non-medical: Not on file  Tobacco Use  . Smoking status: Never Smoker  . Smokeless tobacco: Never Used  Substance and Sexual Activity  . Alcohol use: No  . Drug use: No  . Sexual activity: Not Currently  Lifestyle  . Physical activity:    Days per week: Not on file    Minutes per session: Not on file  . Stress: Not on file  Relationships  . Social connections:    Talks on phone: Not on file    Gets together: Not on file    Attends religious service: Not on file    Active member of club or organization: Not on file    Attends meetings of clubs or organizations: Not on file    Relationship status: Not on file  . Intimate partner violence:    Fear of current or ex partner: Not on file     Emotionally abused: Not on file    Physically abused: Not on file    Forced sexual activity: Not on file  Other Topics Concern  . Not on file  Social History Narrative  . Not on file    Outpatient Encounter Medications as of 11/25/2018  Medication Sig  . acetaminophen (TYLENOL) 500 MG tablet Take 500 mg by mouth 2 (two) times daily as needed for mild pain or headache.   . Calcium Carb-Cholecalciferol (CALCIUM 1000 + D PO) Take 1 tablet by mouth daily.  . famotidine (PEPCID) 20 MG tablet Take 1 tablet (20 mg total) by mouth 2 (two) times daily.  Marland Kitchen loratadine (CLARITIN) 10 MG tablet Take 1 tablet (10 mg total) by mouth daily as needed for allergies.  Marland Kitchen metoCLOPramide (REGLAN) 10 MG tablet TAKE ONE TABLET FOUR TIMES A DAY BEFORE MEALS AND AT BEDTIME  . Multiple Vitamin (MULTIVITAMIN WITH MINERALS) TABS tablet Take 1 tablet by mouth daily.  . [DISCONTINUED] atorvastatin (LIPITOR) 10 MG tablet TAKE ONE (1) TABLET EACH DAY  . rosuvastatin (CRESTOR) 20 MG tablet Take 1 tablet (20 mg total) by mouth every other day.   No facility-administered encounter medications on file as of 11/25/2018.     Allergies  Allergen Reactions  . Pantoprazole Sodium Anaphylaxis and Swelling    Protonix-tongue swelling    Review of Systems  Constitutional: Negative for chills, fatigue, fever and unexpected weight change.  HENT: Negative for trouble swallowing and voice change.        Abnormal tongue movements, intermittent tongue swelling  Respiratory: Negative for cough, choking, chest tightness and shortness of breath.   Cardiovascular: Negative for chest pain, palpitations and leg swelling.  Gastrointestinal: Positive for abdominal pain (epigastric) and constipation. Negative for abdominal distention, anal bleeding, blood in stool, diarrhea, nausea and vomiting.  Musculoskeletal: Negative for arthralgias and myalgias.  Skin: Positive for rash.  Neurological: Negative for weakness.  Hematological:  Negative for adenopathy. Does not bruise/bleed easily.  Psychiatric/Behavioral: Negative for confusion.  All other systems reviewed and are negative.       Objective:  BP (!) 162/92   Pulse (!) 106   Temp (!) 96.8 F (36 C) (Oral)   Ht '5\' 2"'$  (1.575 m)   Wt 148 lb (67.1 kg)   LMP 08/04/1997   BMI 27.07 kg/m    Wt Readings from Last 3 Encounters:  11/25/18 148 lb (67.1 kg)  09/29/18 151 lb (68.5 kg)  09/27/18 149 lb (67.6 kg)    Physical Exam Vitals signs and nursing note reviewed.  Constitutional:      General: She is not in acute  distress.    Appearance: Normal appearance. She is well-developed and well-groomed. She is not ill-appearing or toxic-appearing.  HENT:     Head: Normocephalic and atraumatic.     Jaw: There is normal jaw occlusion.     Comments: Minimal rhythmic tongue movements    Right Ear: Hearing, tympanic membrane, ear canal and external ear normal.     Left Ear: Hearing, tympanic membrane, ear canal and external ear normal.     Nose: Nose normal.     Mouth/Throat:     Lips: Pink.     Mouth: Mucous membranes are moist. No angioedema.     Tongue: No lesions. Tongue does not protrude in midline.     Pharynx: Oropharynx is clear. Uvula midline. No pharyngeal swelling.  Eyes:     Extraocular Movements: Extraocular movements intact.     Conjunctiva/sclera: Conjunctivae normal.     Pupils: Pupils are equal, round, and reactive to light.  Neck:     Musculoskeletal: Normal range of motion and neck supple. No neck rigidity.     Vascular: No carotid bruit.  Cardiovascular:     Rate and Rhythm: Normal rate and regular rhythm.     Heart sounds: Normal heart sounds. No murmur. No friction rub. No gallop.   Pulmonary:     Effort: Pulmonary effort is normal. No respiratory distress.     Breath sounds: Normal breath sounds.  Abdominal:     General: Bowel sounds are normal.     Palpations: Abdomen is soft.     Tenderness: There is no abdominal tenderness.   Skin:    General: Skin is warm and dry.     Capillary Refill: Capillary refill takes less than 2 seconds.     Findings: No erythema or rash.  Neurological:     General: No focal deficit present.     Mental Status: She is alert and oriented to person, place, and time.  Psychiatric:        Mood and Affect: Mood normal.        Behavior: Behavior normal. Behavior is cooperative.        Thought Content: Thought content normal.        Judgment: Judgment normal.     Results for orders placed or performed in visit on 09/29/18  CMP14+EGFR  Result Value Ref Range   Glucose 86 65 - 99 mg/dL   BUN 15 8 - 27 mg/dL   Creatinine, Ser 0.91 0.57 - 1.00 mg/dL   GFR calc non Af Amer 63 >59 mL/min/1.73   GFR calc Af Amer 73 >59 mL/min/1.73   BUN/Creatinine Ratio 16 12 - 28   Sodium 141 134 - 144 mmol/L   Potassium 4.4 3.5 - 5.2 mmol/L   Chloride 103 96 - 106 mmol/L   CO2 21 20 - 29 mmol/L   Calcium 9.9 8.7 - 10.3 mg/dL   Total Protein 6.7 6.0 - 8.5 g/dL   Albumin 4.7 3.5 - 4.8 g/dL   Globulin, Total 2.0 1.5 - 4.5 g/dL   Albumin/Globulin Ratio 2.4 (H) 1.2 - 2.2   Bilirubin Total 0.4 0.0 - 1.2 mg/dL   Alkaline Phosphatase 83 39 - 117 IU/L   AST 20 0 - 40 IU/L   ALT 20 0 - 32 IU/L  CBC with Differential/Platelet  Result Value Ref Range   WBC 5.6 3.4 - 10.8 x10E3/uL   RBC 4.33 3.77 - 5.28 x10E6/uL   Hemoglobin 12.9 11.1 - 15.9 g/dL   Hematocrit 38.0 34.0 -  46.6 %   MCV 88 79 - 97 fL   MCH 29.8 26.6 - 33.0 pg   MCHC 33.9 31.5 - 35.7 g/dL   RDW 12.8 12.3 - 15.4 %   Platelets 274 150 - 450 x10E3/uL   Neutrophils 53 Not Estab. %   Lymphs 38 Not Estab. %   Monocytes 7 Not Estab. %   Eos 1 Not Estab. %   Basos 1 Not Estab. %   Neutrophils Absolute 3.0 1.4 - 7.0 x10E3/uL   Lymphocytes Absolute 2.1 0.7 - 3.1 x10E3/uL   Monocytes Absolute 0.4 0.1 - 0.9 x10E3/uL   EOS (ABSOLUTE) 0.0 0.0 - 0.4 x10E3/uL   Basophils Absolute 0.1 0.0 - 0.2 x10E3/uL   Immature Granulocytes 0 Not Estab. %    Immature Grans (Abs) 0.0 0.0 - 0.1 x10E3/uL  Lipid panel  Result Value Ref Range   Cholesterol, Total 222 (H) 100 - 199 mg/dL   Triglycerides 59 0 - 149 mg/dL   HDL 67 >39 mg/dL   VLDL Cholesterol Cal 12 5 - 40 mg/dL   LDL Calculated 143 (H) 0 - 99 mg/dL   Chol/HDL Ratio 3.3 0.0 - 4.4 ratio  TSH  Result Value Ref Range   TSH 1.560 0.450 - 4.500 uIU/mL     EKG in office: NSR, no ectopy or ST changes. No changes from previous EKG.   Total time spent with patient 65 minutes.  Greater than 50% of encounter spent in coordination of care/counseling.  Pertinent labs & imaging results that were available during my care of the patient were reviewed by me and considered in my medical decision making.  Assessment & Plan:  Marylouise was seen today for acid reflux, swelling and edema and flicking of tongue.  Diagnoses and all orders for this visit:  Adverse drug reaction, initial encounter Pt will try to titrate off of Reglan. Pt to start daily Miralax. Pt to increase water intake. Pt to restart daily Claritin. Will change statin to Crestor every other day. Will continue Pepcid due to allergy to PPI.   Gastroesophageal reflux disease without esophagitis Increasing symptoms. Requesting referral to GI. Pt has an allergy to PPI so will not change therapy today, continue with twice daily Pepcid. Pt concerned Pepcid is causing pruritis of feet, pt to restart daily Claritin. Report any new or worsening symptoms. EKG in office today without changes from 03/12/2017 EKG, no acute findings.  -     Cancel: Ambulatory referral to Gastroenterology -     CMP14+EGFR -     Ambulatory referral to Gastroenterology  Pure hypercholesterolemia Pt and daughter concerned about Lipitor causing increased GERD symptoms. Will stop Lipitor at pts request. Will trial every other day Crestor to see if this helps decrease GERD. Will check labs today and will need a repeat Lipids in 3 months. -     rosuvastatin (CRESTOR) 20 MG  tablet; Take 1 tablet (20 mg total) by mouth every other day. -     Lipid panel -     CMP14+EGFR -     CBC with Differential/Platelet -     EKG 12-Lead  Abnormal rhythmic movement of tongue Discussed cogentin, pt and daughter declined. Pt will titrate her Reglan to see if she can tolerate stopping the medication. Report any new or worsening symptoms.     Continue all other maintenance medications.  Follow up plan: Return in about 1 week (around 12/02/2018) for PCP.  Educational handout given for GERD  The above assessment and  management plan was discussed with the patient. The patient verbalized understanding of and has agreed to the management plan. Patient is aware to call the clinic if symptoms persist or worsen. Patient is aware when to return to the clinic for a follow-up visit. Patient educated on when it is appropriate to go to the emergency department.   Michelle , FNP-C [facility] [phone]  

## 2018-11-25 NOTE — Patient Instructions (Signed)

## 2018-11-26 LAB — CMP14+EGFR
ALT: 25 IU/L (ref 0–32)
AST: 18 IU/L (ref 0–40)
Albumin/Globulin Ratio: 2.3 — ABNORMAL HIGH (ref 1.2–2.2)
Albumin: 4.8 g/dL — ABNORMAL HIGH (ref 3.7–4.7)
Alkaline Phosphatase: 97 IU/L (ref 39–117)
BUN/Creatinine Ratio: 9 — ABNORMAL LOW (ref 12–28)
BUN: 8 mg/dL (ref 8–27)
Bilirubin Total: 0.2 mg/dL (ref 0.0–1.2)
CO2: 21 mmol/L (ref 20–29)
Calcium: 10.3 mg/dL (ref 8.7–10.3)
Chloride: 104 mmol/L (ref 96–106)
Creatinine, Ser: 0.91 mg/dL (ref 0.57–1.00)
GFR calc Af Amer: 73 mL/min/{1.73_m2} (ref >59)
GFR calc non Af Amer: 63 mL/min/{1.73_m2} (ref >59)
Globulin, Total: 2.1 g/dL (ref 1.5–4.5)
Glucose: 97 mg/dL (ref 65–99)
Potassium: 4.6 mmol/L (ref 3.5–5.2)
Sodium: 144 mmol/L (ref 134–144)
Total Protein: 6.9 g/dL (ref 6.0–8.5)

## 2018-11-26 LAB — LIPID PANEL
CHOL/HDL RATIO: 3.1 ratio (ref 0.0–4.4)
Cholesterol, Total: 213 mg/dL — ABNORMAL HIGH (ref 100–199)
HDL: 68 mg/dL (ref >39)
LDL Calculated: 124 mg/dL — ABNORMAL HIGH (ref 0–99)
Triglycerides: 105 mg/dL (ref 0–149)
VLDL Cholesterol Cal: 21 mg/dL (ref 5–40)

## 2018-11-26 LAB — CBC WITH DIFFERENTIAL/PLATELET
Basophils Absolute: 0 10*3/uL (ref 0.0–0.2)
Basos: 1 %
EOS (ABSOLUTE): 0 10*3/uL (ref 0.0–0.4)
Eos: 1 %
HEMOGLOBIN: 13.2 g/dL (ref 11.1–15.9)
Hematocrit: 39.4 % (ref 34.0–46.6)
IMMATURE GRANS (ABS): 0 10*3/uL (ref 0.0–0.1)
Immature Granulocytes: 0 %
LYMPHS: 29 %
Lymphocytes Absolute: 1.5 10*3/uL (ref 0.7–3.1)
MCH: 30.1 pg (ref 26.6–33.0)
MCHC: 33.5 g/dL (ref 31.5–35.7)
MCV: 90 fL (ref 79–97)
MONOCYTES: 7 %
Monocytes Absolute: 0.4 10*3/uL (ref 0.1–0.9)
Neutrophils Absolute: 3.3 10*3/uL (ref 1.4–7.0)
Neutrophils: 62 %
Platelets: 301 10*3/uL (ref 150–450)
RBC: 4.39 x10E6/uL (ref 3.77–5.28)
RDW: 12.9 % (ref 11.7–15.4)
WBC: 5.3 10*3/uL (ref 3.4–10.8)

## 2018-11-28 NOTE — Telephone Encounter (Signed)
Patient seen.

## 2018-12-02 ENCOUNTER — Encounter: Payer: Self-pay | Admitting: Physician Assistant

## 2018-12-02 ENCOUNTER — Ambulatory Visit: Payer: Medicare Other | Admitting: Physician Assistant

## 2018-12-02 VITALS — BP 118/66 | HR 101 | Temp 97.3°F | Ht 62.0 in | Wt 147.8 lb

## 2018-12-02 DIAGNOSIS — Z789 Other specified health status: Secondary | ICD-10-CM

## 2018-12-02 DIAGNOSIS — K219 Gastro-esophageal reflux disease without esophagitis: Secondary | ICD-10-CM | POA: Diagnosis not present

## 2018-12-02 MED ORDER — EPINEPHRINE 0.3 MG/0.3ML IJ SOAJ: 0.3000 mg | INTRAMUSCULAR | 2 refills | Status: DC | PRN

## 2018-12-02 NOTE — Progress Notes (Signed)
BP 118/66   Pulse (!) 101   Temp (!) 97.3 F (36.3 C) (Oral)   Ht '5\' 2"'$  (1.575 m)   Wt 147 lb 12.8 oz (67 kg)   LMP 08/04/1997   BMI 27.03 kg/m    Subjective:    Patient ID: Jackie Jackson, female    DOB: June 01, 1946, 73 y.o.   MRN: [id number]  HPI: Nissi Doffing Commons is a 73 y.o. female presenting on 12/02/2018 for Gastroesophageal Reflux (1 week follow up ); Constipation; Abdominal Pain; and Extremity Weakness  She returns about the reaction she has been having likely due to reglan. She has severe GERD symptoms for several years but had anaphylaxis to pantoprazole. No there PPI has ever been tried. She has been on multiple H2 blockers over the years without significant I,provement.  Thus the reglan was used and gave her relief.  She has worked on water intake and dietary changes and feeling slight improvement.  Past Medical History:  Diagnosis Date  . Acid reflux   . High cholesterol   . Osteopenia    2006 -1.4, 2009 -1.9, 2011 -1.9   Relevant past medical, surgical, family and social history reviewed and updated as indicated. Interim medical history since our last visit reviewed. Allergies and medications reviewed and updated. DATA REVIEWED: CHART IN EPIC  Family History reviewed for pertinent findings.  Review of Systems  Constitutional: Negative.  Negative for activity change, chills, diaphoresis, fatigue, fever and unexpected weight change.  HENT: Negative.   Eyes: Negative.   Respiratory: Negative.  Negative for cough, shortness of breath and wheezing.   Cardiovascular: Negative.  Negative for chest pain.  Gastrointestinal: Positive for abdominal distention and abdominal pain. Negative for rectal pain and vomiting.  Endocrine: Negative.   Genitourinary: Negative.  Negative for dysuria.  Musculoskeletal: Negative.  Negative for arthralgias.  Skin: Negative.   Neurological: Negative.     Allergies as of 12/02/2018      Reactions   Pantoprazole Sodium Anaphylaxis,  Swelling   Protonix-tongue swelling      Medication List       Accurate as of December 02, 2018 11:59 PM. Always use your most recent med list.        acetaminophen 500 MG tablet Commonly known as:  TYLENOL Take 500 mg by mouth 2 (two) times daily as needed for mild pain or headache.   CALCIUM 1000 + D PO Take 1 tablet by mouth daily.   EPINEPHrine 0.3 mg/0.3 mL Soaj injection Commonly known as:  EpiPen 2-Pak Inject 0.3 mLs (0.3 mg total) into the muscle as needed for anaphylaxis.   famotidine 20 MG tablet Commonly known as:  Pepcid Take 1 tablet (20 mg total) by mouth 2 (two) times daily.   loratadine 10 MG tablet Commonly known as:  CLARITIN Take 1 tablet (10 mg total) by mouth daily as needed for allergies.   multivitamin with minerals Tabs tablet Take 1 tablet by mouth daily.          Objective:    BP 118/66   Pulse (!) 101   Temp (!) 97.3 F (36.3 C) (Oral)   Ht '5\' 2"'$  (1.575 m)   Wt 147 lb 12.8 oz (67 kg)   LMP 08/04/1997   BMI 27.03 kg/m   Allergies  Allergen Reactions  . Pantoprazole Sodium Anaphylaxis and Swelling    Protonix-tongue swelling  . Statins     myalgia    Wt Readings from Last 3 Encounters:  12/02/18 147  lb 12.8 oz (67 kg)  11/25/18 148 lb (67.1 kg)  09/29/18 151 lb (68.5 kg)    Physical Exam Constitutional:      Appearance: She is well-developed.  HENT:     Head: Normocephalic and atraumatic.     Right Ear: Tympanic membrane, ear canal and external ear normal.     Left Ear: Tympanic membrane, ear canal and external ear normal.     Nose: Nose normal. No rhinorrhea.     Mouth/Throat:     Pharynx: No oropharyngeal exudate or posterior oropharyngeal erythema.  Eyes:     Conjunctiva/sclera: Conjunctivae normal.     Pupils: Pupils are equal, round, and reactive to light.  Neck:     Musculoskeletal: Normal range of motion and neck supple.  Cardiovascular:     Rate and Rhythm: Normal rate and regular rhythm.     Heart sounds:  Normal heart sounds.  Pulmonary:     Effort: Pulmonary effort is normal.     Breath sounds: Normal breath sounds.  Abdominal:     General: Bowel sounds are normal.     Palpations: Abdomen is soft.  Skin:    General: Skin is warm and dry.     Findings: No rash.  Neurological:     Mental Status: She is alert and oriented to person, place, and time.     Deep Tendon Reflexes: Reflexes are normal and symmetric.  Psychiatric:        Behavior: Behavior normal.        Thought Content: Thought content normal.        Judgment: Judgment normal.     Results for orders placed or performed in visit on 11/25/18  Lipid panel  Result Value Ref Range   Cholesterol, Total 213 (H) 100 - 199 mg/dL   Triglycerides 105 0 - 149 mg/dL   HDL 68 >39 mg/dL   VLDL Cholesterol Cal 21 5 - 40 mg/dL   LDL Calculated 124 (H) 0 - 99 mg/dL   Chol/HDL Ratio 3.1 0.0 - 4.4 ratio  CMP14+EGFR  Result Value Ref Range   Glucose 97 65 - 99 mg/dL   BUN 8 8 - 27 mg/dL   Creatinine, Ser 0.91 0.57 - 1.00 mg/dL   GFR calc non Af Amer 63 >59 mL/min/1.73   GFR calc Af Amer 73 >59 mL/min/1.73   BUN/Creatinine Ratio 9 (L) 12 - 28   Sodium 144 134 - 144 mmol/L   Potassium 4.6 3.5 - 5.2 mmol/L   Chloride 104 96 - 106 mmol/L   CO2 21 20 - 29 mmol/L   Calcium 10.3 8.7 - 10.3 mg/dL   Total Protein 6.9 6.0 - 8.5 g/dL   Albumin 4.8 (H) 3.7 - 4.7 g/dL   Globulin, Total 2.1 1.5 - 4.5 g/dL   Albumin/Globulin Ratio 2.3 (H) 1.2 - 2.2   Bilirubin Total 0.2 0.0 - 1.2 mg/dL   Alkaline Phosphatase 97 39 - 117 IU/L   AST 18 0 - 40 IU/L   ALT 25 0 - 32 IU/L  CBC with Differential/Platelet  Result Value Ref Range   WBC 5.3 3.4 - 10.8 x10E3/uL   RBC 4.39 3.77 - 5.28 x10E6/uL   Hemoglobin 13.2 11.1 - 15.9 g/dL   Hematocrit 39.4 34.0 - 46.6 %   MCV 90 79 - 97 fL   MCH 30.1 26.6 - 33.0 pg   MCHC 33.5 31.5 - 35.7 g/dL   RDW 12.9 11.7 - 15.4 %   Platelets 301 150 -  450 x10E3/uL   Neutrophils 62 Not Estab. %   Lymphs 29 Not Estab. %    Monocytes 7 Not Estab. %   Eos 1 Not Estab. %   Basos 1 Not Estab. %   Neutrophils Absolute 3.3 1.4 - 7.0 x10E3/uL   Lymphocytes Absolute 1.5 0.7 - 3.1 x10E3/uL   Monocytes Absolute 0.4 0.1 - 0.9 x10E3/uL   EOS (ABSOLUTE) 0.0 0.0 - 0.4 x10E3/uL   Basophils Absolute 0.0 0.0 - 0.2 x10E3/uL   Immature Granulocytes 0 Not Estab. %   Immature Grans (Abs) 0.0 0.0 - 0.1 x10E3/uL      Assessment & Plan:   1. Gastroesophageal reflux disease without esophagitis Pepcid, not improving reglan stooped Gastroenterology appointment Dr. Newman Pies  2. Statin intolerance Diet and exercise Recheck labs in a couple of months  Continue all other maintenance medications as listed above.  Follow up plan: No follow-ups on file.  Educational handout given for [place] PA-C [place] [address]  [place], [place] [address] [phone]   12/04/2018, 11:22 PM

## 2018-12-04 DIAGNOSIS — Z789 Other specified health status: Secondary | ICD-10-CM | POA: Insufficient documentation

## 2018-12-20 ENCOUNTER — Ambulatory Visit: Payer: Medicare Other

## 2019-01-18 ENCOUNTER — Ambulatory Visit: Payer: Medicare Other

## 2019-03-08 ENCOUNTER — Other Ambulatory Visit: Payer: Self-pay

## 2019-03-08 ENCOUNTER — Ambulatory Visit
Admission: RE | Admit: 2019-03-08 | Discharge: 2019-03-08 | Disposition: A | Discharge status: Home / Self Care | Payer: Medicare Other | Source: Ambulatory Visit | Attending: Women's Health | Admitting: Women's Health

## 2019-03-08 DIAGNOSIS — Z1231 Encounter for screening mammogram for malignant neoplasm of breast: Secondary | ICD-10-CM

## 2019-08-29 ENCOUNTER — Other Ambulatory Visit: Payer: Self-pay | Admitting: Physician Assistant

## 2019-08-29 DIAGNOSIS — J301 Allergic rhinitis due to pollen: Secondary | ICD-10-CM

## 2019-09-20 ENCOUNTER — Ambulatory Visit (INDEPENDENT_AMBULATORY_CARE_PROVIDER_SITE_OTHER): Payer: Medicare Other | Admitting: Physician Assistant

## 2019-09-20 ENCOUNTER — Encounter: Payer: Self-pay | Admitting: Physician Assistant

## 2019-09-20 ENCOUNTER — Other Ambulatory Visit: Payer: Self-pay

## 2019-09-20 VITALS — BP 160/80 | HR 78 | Temp 95.0°F | Ht 62.0 in | Wt 144.0 lb

## 2019-09-20 DIAGNOSIS — J301 Allergic rhinitis due to pollen: Secondary | ICD-10-CM

## 2019-09-20 DIAGNOSIS — Z0000 Encounter for general adult medical examination without abnormal findings: Secondary | ICD-10-CM

## 2019-09-20 DIAGNOSIS — K219 Gastro-esophageal reflux disease without esophagitis: Secondary | ICD-10-CM | POA: Diagnosis not present

## 2019-09-21 LAB — CBC WITH DIFFERENTIAL/PLATELET
Basophils Absolute: 0.1 10*3/uL (ref 0.0–0.2)
Basos: 1 %
EOS (ABSOLUTE): 0.1 10*3/uL (ref 0.0–0.4)
Eos: 1 %
Hematocrit: 40.2 % (ref 34.0–46.6)
Hemoglobin: 13 g/dL (ref 11.1–15.9)
Immature Grans (Abs): 0 10*3/uL (ref 0.0–0.1)
Immature Granulocytes: 0 %
Lymphocytes Absolute: 2.1 10*3/uL (ref 0.7–3.1)
Lymphs: 43 %
MCH: 29.3 pg (ref 26.6–33.0)
MCHC: 32.3 g/dL (ref 31.5–35.7)
MCV: 91 fL (ref 79–97)
Monocytes Absolute: 0.3 10*3/uL (ref 0.1–0.9)
Monocytes: 7 %
Neutrophils Absolute: 2.3 10*3/uL (ref 1.4–7.0)
Neutrophils: 48 %
Platelets: 257 10*3/uL (ref 150–450)
RBC: 4.43 x10E6/uL (ref 3.77–5.28)
RDW: 13 % (ref 11.7–15.4)
WBC: 4.9 10*3/uL (ref 3.4–10.8)

## 2019-09-21 LAB — CMP14+EGFR
ALT: 14 IU/L (ref 0–32)
AST: 17 IU/L (ref 0–40)
Albumin/Globulin Ratio: 2.4 — ABNORMAL HIGH (ref 1.2–2.2)
Albumin: 4.6 g/dL (ref 3.7–4.7)
Alkaline Phosphatase: 84 IU/L (ref 39–117)
BUN/Creatinine Ratio: 11 — ABNORMAL LOW (ref 12–28)
BUN: 9 mg/dL (ref 8–27)
Bilirubin Total: 0.3 mg/dL (ref 0.0–1.2)
CO2: 22 mmol/L (ref 20–29)
Calcium: 9.5 mg/dL (ref 8.7–10.3)
Chloride: 101 mmol/L (ref 96–106)
Creatinine, Ser: 0.83 mg/dL (ref 0.57–1.00)
GFR calc Af Amer: 81 mL/min/{1.73_m2} (ref >59)
GFR calc non Af Amer: 70 mL/min/{1.73_m2} (ref >59)
Globulin, Total: 1.9 g/dL (ref 1.5–4.5)
Glucose: 91 mg/dL (ref 65–99)
Potassium: 4.8 mmol/L (ref 3.5–5.2)
Sodium: 138 mmol/L (ref 134–144)
Total Protein: 6.5 g/dL (ref 6.0–8.5)

## 2019-09-21 LAB — LIPID PANEL
Chol/HDL Ratio: 3.8 ratio (ref 0.0–4.4)
Cholesterol, Total: 246 mg/dL — ABNORMAL HIGH (ref 100–199)
HDL: 65 mg/dL (ref >39)
LDL Chol Calc (NIH): 163 mg/dL — ABNORMAL HIGH (ref 0–99)
Triglycerides: 102 mg/dL (ref 0–149)
VLDL Cholesterol Cal: 18 mg/dL (ref 5–40)

## 2019-09-21 LAB — TSH: TSH: 1.93 u[IU]/mL (ref 0.450–4.500)

## 2019-09-26 ENCOUNTER — Encounter: Payer: Self-pay | Admitting: Physician Assistant

## 2019-09-26 NOTE — Progress Notes (Signed)
BP (!) 160/80   Pulse 78   Temp (!) 95 F (35 C) (Temporal)   Ht _0  (1.575 m)   Wt 144 lb (65.3 kg)   LMP 08/04/1997   SpO2 97%   BMI 26.34 kg/m    Subjective:    Patient ID: Jackie Jackson, female    DOB: Feb 21, 1946, 73 y.o.   MRN: [id number]  HPI: Jackie Jackson is a 73 y.o. female presenting on 09/20/2019 for Medical Management of Chronic Issues  Patient comes in for a 1 year recheck on her chronic conditions.  She does have GERD and allergic rhinitis.  However she reports that she is doing very well with both.  She does well with the Claritin.  She also has been able to only have to use over-the-counter medications for occasional heartburn.  This is a huge improvement from where she was at one time.  She is doing very well otherwise.  And has no other complaints.  Past Medical History:  Diagnosis Date  . Acid reflux   . High cholesterol   . Osteopenia    2006 -1.4, 2009 -1.9, 2011 -1.9   Relevant past medical, surgical, family and social history reviewed and updated as indicated. Interim medical history since our last visit reviewed. Allergies and medications reviewed and updated. DATA REVIEWED: CHART IN EPIC  Family History reviewed for pertinent findings.  Review of Systems  Constitutional: Negative.   HENT: Negative.   Eyes: Negative.   Respiratory: Negative.   Gastrointestinal: Negative.   Genitourinary: Negative.     Allergies as of 09/20/2019      Reactions   Pantoprazole Sodium Anaphylaxis, Swelling   Protonix-tongue swelling   Statins    myalgia      Medication List       Accurate as of September 20, 2019 11:59 PM. If you have any questions, ask your nurse or doctor.        STOP taking these medications   famotidine 20 MG tablet Commonly known as: Pepcid Stopped by: Terald Sleeper, PA-C     TAKE these medications   acetaminophen 500 MG tablet Commonly known as: TYLENOL Take 500 mg by mouth 2 (two) times daily as needed for mild pain or  headache.   CALCIUM 1000 + D PO Take 1 tablet by mouth daily.   EPINEPHrine 0.3 mg/0.3 mL Soaj injection Commonly known as: EpiPen 2-Pak Inject 0.3 mLs (0.3 mg total) into the muscle as needed for anaphylaxis.   loratadine 10 MG tablet Commonly known as: CLARITIN TAKE ONE (1) TABLET EACH DAY   multivitamin with minerals Tabs tablet Take 1 tablet by mouth daily.          Objective:    BP (!) 160/80   Pulse 78   Temp (!) 95 F (35 C) (Temporal)   Ht _1  (1.575 m)   Wt 144 lb (65.3 kg)   LMP 08/04/1997   SpO2 97%   BMI 26.34 kg/m   Allergies  Allergen Reactions  . Pantoprazole Sodium Anaphylaxis and Swelling    Protonix-tongue swelling  . Statins     myalgia    Wt Readings from Last 3 Encounters:  09/20/19 144 lb (65.3 kg)  12/02/18 147 lb 12.8 oz (67 kg)  11/25/18 148 lb (67.1 kg)    Physical Exam Constitutional:      General: She is not in acute distress.    Appearance: Normal appearance. She is well-developed.  HENT:  Head: Normocephalic and atraumatic.  Cardiovascular:     Rate and Rhythm: Normal rate.  Pulmonary:     Effort: Pulmonary effort is normal.  Skin:    General: Skin is warm and dry.     Findings: No rash.  Neurological:     Mental Status: She is alert and oriented to person, place, and time.     Deep Tendon Reflexes: Reflexes are normal and symmetric.     Results for orders placed or performed in visit on 09/20/19  CBC with Differential/Platelet  Result Value Ref Range   WBC 4.9 3.4 - 10.8 x10E3/uL   RBC 4.43 3.77 - 5.28 x10E6/uL   Hemoglobin 13.0 11.1 - 15.9 g/dL   Hematocrit 40.2 34.0 - 46.6 %   MCV 91 79 - 97 fL   MCH 29.3 26.6 - 33.0 pg   MCHC 32.3 31.5 - 35.7 g/dL   RDW 13.0 11.7 - 15.4 %   Platelets 257 150 - 450 x10E3/uL   Neutrophils 48 Not Estab. %   Lymphs 43 Not Estab. %   Monocytes 7 Not Estab. %   Eos 1 Not Estab. %   Basos 1 Not Estab. %   Neutrophils Absolute 2.3 1.4 - 7.0 x10E3/uL   Lymphocytes Absolute  2.1 0.7 - 3.1 x10E3/uL   Monocytes Absolute 0.3 0.1 - 0.9 x10E3/uL   EOS (ABSOLUTE) 0.1 0.0 - 0.4 x10E3/uL   Basophils Absolute 0.1 0.0 - 0.2 x10E3/uL   Immature Granulocytes 0 Not Estab. %   Immature Grans (Abs) 0.0 0.0 - 0.1 x10E3/uL  CMP14+EGFR  Result Value Ref Range   Glucose 91 65 - 99 mg/dL   BUN 9 8 - 27 mg/dL   Creatinine, Ser 0.83 0.57 - 1.00 mg/dL   GFR calc non Af Amer 70 >59 mL/min/1.73   GFR calc Af Amer 81 >59 mL/min/1.73   BUN/Creatinine Ratio 11 (L) 12 - 28   Sodium 138 134 - 144 mmol/L   Potassium 4.8 3.5 - 5.2 mmol/L   Chloride 101 96 - 106 mmol/L   CO2 22 20 - 29 mmol/L   Calcium 9.5 8.7 - 10.3 mg/dL   Total Protein 6.5 6.0 - 8.5 g/dL   Albumin 4.6 3.7 - 4.7 g/dL   Globulin, Total 1.9 1.5 - 4.5 g/dL   Albumin/Globulin Ratio 2.4 (H) 1.2 - 2.2   Bilirubin Total 0.3 0.0 - 1.2 mg/dL   Alkaline Phosphatase 84 39 - 117 IU/L   AST 17 0 - 40 IU/L   ALT 14 0 - 32 IU/L  Lipid Panel  Result Value Ref Range   Cholesterol, Total 246 (H) 100 - 199 mg/dL   Triglycerides 102 0 - 149 mg/dL   HDL 65 >39 mg/dL   VLDL Cholesterol Cal 18 5 - 40 mg/dL   LDL Chol Calc (NIH) 163 (H) 0 - 99 mg/dL   Chol/HDL Ratio 3.8 0.0 - 4.4 ratio  TSH  Result Value Ref Range   TSH 1.930 0.450 - 4.500 uIU/mL      Assessment & Plan:   1. Chronic seasonal allergic rhinitis due to pollen claritin 10 mg one daily  2. Gastroesophageal reflux disease without esophagitis OTC controlling well  3. Well adult exam - CBC with Differential/Platelet - CMP14+EGFR - Lipid Panel - TSH    Continue all other maintenance medications as listed above.  Follow up plan: No follow-ups on file.  Educational handout given for [place] PA-C [place]  [place], [place] [address] [phone]   09/26/2019, 5:00 PM

## 2019-11-20 ENCOUNTER — Telehealth: Payer: Self-pay | Admitting: Physician Assistant

## 2019-11-20 NOTE — Telephone Encounter (Signed)
Patient states she got a letter in the mail that it is time for her to do her cologuard.  Requesting Angel send in the order for her.  Please advise

## 2019-11-21 ENCOUNTER — Other Ambulatory Visit: Payer: Self-pay | Admitting: Physician Assistant

## 2019-11-21 DIAGNOSIS — Z1211 Encounter for screening for malignant neoplasm of colon: Secondary | ICD-10-CM

## 2019-11-21 NOTE — Telephone Encounter (Signed)
Order placed

## 2019-11-21 NOTE — Telephone Encounter (Signed)
Patient aware.

## 2019-12-07 DIAGNOSIS — Z1211 Encounter for screening for malignant neoplasm of colon: Secondary | ICD-10-CM | POA: Diagnosis not present

## 2019-12-14 LAB — COLOGUARD: COLOGUARD: NEGATIVE

## 2019-12-18 LAB — COLOGUARD: Cologuard: NEGATIVE

## 2020-02-05 ENCOUNTER — Other Ambulatory Visit: Payer: Self-pay | Admitting: Physician Assistant

## 2020-02-05 DIAGNOSIS — Z1231 Encounter for screening mammogram for malignant neoplasm of breast: Secondary | ICD-10-CM

## 2020-03-08 ENCOUNTER — Ambulatory Visit
Admission: RE | Admit: 2020-03-08 | Discharge: 2020-03-08 | Disposition: A | Discharge status: Home / Self Care | Payer: Medicare PPO | Source: Ambulatory Visit | Attending: *Deleted | Admitting: *Deleted

## 2020-03-08 ENCOUNTER — Other Ambulatory Visit: Payer: Self-pay

## 2020-03-08 DIAGNOSIS — Z1231 Encounter for screening mammogram for malignant neoplasm of breast: Secondary | ICD-10-CM

## 2020-03-13 ENCOUNTER — Other Ambulatory Visit: Payer: Self-pay | Admitting: Physician Assistant

## 2020-03-13 ENCOUNTER — Other Ambulatory Visit: Payer: Self-pay | Admitting: Family Medicine

## 2020-03-13 ENCOUNTER — Other Ambulatory Visit: Payer: Self-pay | Admitting: Women's Health

## 2020-03-13 DIAGNOSIS — R928 Other abnormal and inconclusive findings on diagnostic imaging of breast: Secondary | ICD-10-CM

## 2020-03-22 ENCOUNTER — Ambulatory Visit
Admission: RE | Admit: 2020-03-22 | Discharge: 2020-03-22 | Disposition: A | Discharge status: Home / Self Care | Payer: Medicare PPO | Source: Ambulatory Visit | Attending: Family Medicine | Admitting: Family Medicine

## 2020-03-22 ENCOUNTER — Other Ambulatory Visit: Payer: Medicare PPO

## 2020-03-22 ENCOUNTER — Other Ambulatory Visit: Payer: Self-pay

## 2020-03-22 DIAGNOSIS — R928 Other abnormal and inconclusive findings on diagnostic imaging of breast: Secondary | ICD-10-CM

## 2020-03-22 DIAGNOSIS — N6489 Other specified disorders of breast: Secondary | ICD-10-CM | POA: Diagnosis not present

## 2020-09-11 ENCOUNTER — Encounter: Payer: Self-pay | Admitting: Family Medicine

## 2020-09-11 ENCOUNTER — Other Ambulatory Visit: Payer: Self-pay

## 2020-09-11 ENCOUNTER — Ambulatory Visit: Payer: Medicare Other | Admitting: Family Medicine

## 2020-09-11 VITALS — BP 128/67 | HR 77 | Temp 97.4°F | Ht 62.0 in | Wt 144.4 lb

## 2020-09-11 DIAGNOSIS — Z1231 Encounter for screening mammogram for malignant neoplasm of breast: Secondary | ICD-10-CM

## 2020-09-11 DIAGNOSIS — Z0000 Encounter for general adult medical examination without abnormal findings: Secondary | ICD-10-CM | POA: Diagnosis not present

## 2020-09-11 DIAGNOSIS — E7800 Pure hypercholesterolemia, unspecified: Secondary | ICD-10-CM | POA: Diagnosis not present

## 2020-09-11 DIAGNOSIS — M8580 Other specified disorders of bone density and structure, unspecified site: Secondary | ICD-10-CM | POA: Diagnosis not present

## 2020-09-11 DIAGNOSIS — Z789 Other specified health status: Secondary | ICD-10-CM

## 2020-09-11 DIAGNOSIS — K219 Gastro-esophageal reflux disease without esophagitis: Secondary | ICD-10-CM | POA: Diagnosis not present

## 2020-09-11 DIAGNOSIS — Z7689 Persons encountering health services in other specified circumstances: Secondary | ICD-10-CM | POA: Diagnosis not present

## 2020-09-11 DIAGNOSIS — R6889 Other general symptoms and signs: Secondary | ICD-10-CM | POA: Diagnosis not present

## 2020-09-11 DIAGNOSIS — Z6826 Body mass index (BMI) 26.0-26.9, adult: Secondary | ICD-10-CM | POA: Diagnosis not present

## 2020-09-11 NOTE — Progress Notes (Signed)
New Patient Office Visit  Subjective:  Patient ID: Jackie Jackson, female    DOB: December 24, 1945  Age: 74 y.o. MRN: [id number]  CC:  Chief Complaint  Patient presents with  . Establish Care    HPI Jackie Jackson presents to establish care. She denies new concerns. She ate bacon, eggs, and toast and black coffee about 3 hours ago. She tries to eat a healthy diet. She has also been trying to be more active and walk more often.   1. GERD She reports occasional heartburn that is managed by her diet and Tums occasional.   2. Osteopenia She is taking a daily calcium and vitamin D supplement.  Past Medical History:  Diagnosis Date  . Acid reflux   . High cholesterol   . Osteopenia    2006 -1.4, 2009 -1.9, 2011 -1.9    Past Surgical History:  Procedure Laterality Date  . BREAST BIOPSY  2012  . CATARACT EXTRACTION W/PHACO Left 03/22/2017   Procedure: CATARACT EXTRACTION PHACO AND INTRAOCULAR LENS PLACEMENT (IOC);  Surgeon: Tonny Branch, MD;  Location: [facility];  Service: Ophthalmology;  Laterality: Left;  CDE:  8.65  . CATARACT EXTRACTION W/PHACO Right 04/05/2017   Procedure: CATARACT EXTRACTION PHACO AND INTRAOCULAR LENS PLACEMENT (IOC);  Surgeon: Tonny Branch, MD;  Location: [facility];  Service: Ophthalmology;  Laterality: Right;  CDE:  5.23   . SKIN CANCER DESTRUCTION  2012   rt. lower leg  . TUBAL LIGATION      Family History  Problem Relation Age of Onset  . Diabetes Brother   . Heart disease Father   . Heart disease Mother   . Cancer Sister        lung  . Healthy Daughter   . Healthy Son   . Heart disease Maternal Grandmother     Social History   Socioeconomic History  . Marital status: Married    Spouse name: [name]  . Number of children: 2  . Years of education: Not on file  . Highest education level: Not on file  Occupational History  . 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 Smoking status: Never Smoker  . Smokeless  tobacco: Never Used  Vaping Use  . Vaping Use: Never used  Substance and Sexual Activity  . Alcohol use: No  . Drug use: No  . Sexual activity: Not Currently  Other Topics Concern  . Not on file  Social History Narrative  . Not on file   Social Determinants of Health   Financial Resource Strain: Not on file  Food Insecurity: Not on file  Transportation Needs: Not on file  Physical Activity: Not on file  Stress: Not on file  Social Connections: Not on file  Intimate Partner Violence: Not on file    ROS Review of Systems Negative unless specially indicated above in HPI.  Objective:   Today's Vitals: BP 128/67   Pulse 77   Temp (!) 97.4 F (36.3 C) (Temporal)   Ht _0  (1.575 m)   Wt 144 lb 6 oz (65.5 kg)   LMP 08/04/1997   BMI 26.41 kg/m   Physical Exam Vitals and nursing note reviewed.  Constitutional:      General: She is not in acute distress.    Appearance: Normal appearance. She is not ill-appearing, toxic-appearing or diaphoretic.  HENT:     Head: Normocephalic and atraumatic.     Right  Ear: Tympanic membrane, ear canal and external ear normal.     Left Ear: Tympanic membrane, ear canal and external ear normal.     Nose: Nose normal.     Mouth/Throat:     Mouth: Mucous membranes are moist.     Pharynx: Oropharynx is clear.  Eyes:     Extraocular Movements: Extraocular movements intact.     Conjunctiva/sclera: Conjunctivae normal.     Pupils: Pupils are equal, round, and reactive to light.  Neck:     Vascular: No carotid bruit.  Cardiovascular:     Rate and Rhythm: Normal rate and regular rhythm.     Heart sounds: Normal heart sounds. No murmur heard.   Pulmonary:     Effort: Pulmonary effort is normal. No respiratory distress.     Breath sounds: Normal breath sounds.  Abdominal:     General: Bowel sounds are normal. There is no distension.     Palpations: Abdomen is soft.     Tenderness: There is no abdominal tenderness. There is no guarding  or rebound.  Musculoskeletal:     Cervical back: Normal range of motion and neck supple. No rigidity or tenderness.     Right lower leg: No edema.     Left lower leg: No edema.  Skin:    General: Skin is warm and dry.  Neurological:     General: No focal deficit present.     Mental Status: She is alert and oriented to person, place, and time.     Cranial Nerves: No cranial nerve deficit.     Motor: No weakness.  Psychiatric:        Mood and Affect: Mood normal.        Behavior: Behavior normal.     Assessment & Plan:   Emmalee was seen today for establish care.  Diagnoses and all orders for this visit:  Pure hypercholesterolemia/Statin intolerance  -     Lipid panel  BMI 26.0-26.9,adult Healthy diet and exercise as tolerated.  -     CBC with Differential/Platelet -     CMP14+EGFR -     TSH -     Lipid panel -     Vitamin B12 -     VITAMIN D 25 Hydroxy (Vit-D Deficiency, Fractures)  Gastroesophageal reflux disease without esophagitis Well controlled with diet and Tums.   Encounter for screening mammogram for malignant neoplasm of breast -     MM Digital Screening; Future  Osteopenia, unspecified location Taking vitamin D and calcium supplement -     VITAMIN D 25 Hydroxy (Vit-D Deficiency, Fractures)  Encounter to establish care  Follow-up: 1 year for CPE, sooner for new or worsening symptoms.   The patient indicates understanding of these issues and agrees with the plan.   Gwenlyn Perking, FNP

## 2020-09-11 NOTE — Patient Instructions (Signed)
° ° ° °Heartburn °Heartburn is a type of pain or discomfort that can happen in the throat or chest. It is often described as a burning pain. It may also cause a bad, acid-like taste in the mouth. Heartburn may feel worse when you lie down or bend over, and it is often worse at night. Heartburn may be caused by stomach contents that move back up into the esophagus (reflux). °Follow these instructions at home: °Eating and drinking ° °· Avoid certain foods and drinks as told by your health care provider. This may include: °? Coffee and tea (with or without caffeine). °? Drinks that contain alcohol. °? Energy drinks and sports drinks. °? Carbonated drinks or sodas. °? Chocolate and cocoa. °? Peppermint and mint flavorings. °? Garlic and onions. °? Horseradish. °? Spicy and acidic foods, including peppers, chili powder, curry powder, vinegar, hot sauces, and barbecue sauce. °? Citrus fruit juices and citrus fruits, such as oranges, lemons, and limes. °? Tomato-based foods, such as red sauce, chili, salsa, and pizza with red sauce. °? Fried and fatty foods, such as donuts, french fries, potato chips, and high-fat dressings. °? High-fat meats, such as hot dogs and fatty cuts of red and white meats, such as rib eye steak, sausage, ham, and bacon. °? High-fat dairy items, such as whole milk, butter, and cream cheese. °· Eat small, frequent meals instead of large meals. °· Avoid drinking large amounts of liquid with your meals. °· Avoid eating meals during the 2-3 hours before bedtime. °· Avoid lying down right after you eat. °· Do not exercise right after you eat. °Lifestyle ° °  ° °· If you are overweight, reduce your weight to an amount that is healthy for you. Ask your health care provider for guidance about a safe weight loss goal. °· Do not use any products that contain nicotine or tobacco, such as cigarettes, e-cigarettes, and chewing tobacco. These can make your symptoms worse. If you need help quitting, ask your  health care provider. °· Wear loose-fitting clothing. Do not wear anything tight around your waist that causes pressure on your abdomen. °· Raise (elevate) the head of your bed about 6 inches (15 cm) when you sleep. °· Try to reduce your stress, such as with yoga or meditation. If you need help reducing stress, ask your health care provider. °General instructions °· Pay attention to any changes in your symptoms. °· Take over-the-counter and prescription medicines only as told by your health care provider. °? Do not take aspirin, ibuprofen, or other NSAIDs unless your health care provider told you to do so. °? Stop medicines only as told by your health care provider. If you stop taking some medicines too quickly, your symptoms may get worse. °· Keep all follow-up visits as told by your health care provider. This is important. °Contact a health care provider if: °· You have new symptoms. °· You have unexplained weight loss. °· You have difficulty swallowing, or it hurts to swallow. °· You have wheezing or a persistent cough. °· Your symptoms do not improve with treatment. °· You have frequent heartburn for more than 2 weeks. °Get help right away if: °· You have pain in your arms, neck, jaw, teeth, or back. °· You feel sweaty, dizzy, or light-headed. °· You have chest pain or shortness of breath. °· You vomit and your vomit looks like blood or coffee grounds. °· Your stool is bloody or black. °These symptoms may represent a serious problem that is an   emergency. Do not wait to see if the symptoms will go away. Get medical help right away. Call your local emergency services (911 in the [place] Do not drive yourself to the hospital. °Summary °· Heartburn is a type of pain or discomfort that can happen in the throat or chest. It is often described as a burning pain. It may also cause a bad, acid-like taste in the mouth. °· Avoid certain foods and drinks as told by your health care provider. °· Take over-the-counter and  prescription medicines only as told by your health care provider. Do not take aspirin, ibuprofen, or other NSAIDs unless your health care provider told you to do so. °· Contact a health care provider if your symptoms do not improve or they get worse. °This information is not intended to replace advice given to you by your health care provider. Make sure you discuss any questions you have with your health care provider. °Document Revised: 02/14/2018 Document Reviewed: 02/14/2018 °Elsevier Patient Education © 2020 [employer] ° °

## 2020-09-12 LAB — CBC WITH DIFFERENTIAL/PLATELET
Basophils Absolute: 0.1 10*3/uL (ref 0.0–0.2)
Basos: 1 %
EOS (ABSOLUTE): 0.1 10*3/uL (ref 0.0–0.4)
Eos: 2 %
Hematocrit: 38.5 % (ref 34.0–46.6)
Hemoglobin: 12.7 g/dL (ref 11.1–15.9)
Immature Grans (Abs): 0 10*3/uL (ref 0.0–0.1)
Immature Granulocytes: 0 %
Lymphocytes Absolute: 2.3 10*3/uL (ref 0.7–3.1)
Lymphs: 37 %
MCH: 30.1 pg (ref 26.6–33.0)
MCHC: 33 g/dL (ref 31.5–35.7)
MCV: 91 fL (ref 79–97)
Monocytes Absolute: 0.3 10*3/uL (ref 0.1–0.9)
Monocytes: 5 %
Neutrophils Absolute: 3.3 10*3/uL (ref 1.4–7.0)
Neutrophils: 55 %
Platelets: 290 10*3/uL (ref 150–450)
RBC: 4.22 x10E6/uL (ref 3.77–5.28)
RDW: 12.9 % (ref 11.7–15.4)
WBC: 6.1 10*3/uL (ref 3.4–10.8)

## 2020-09-12 LAB — CMP14+EGFR
ALT: 12 IU/L (ref 0–32)
AST: 14 IU/L (ref 0–40)
Albumin/Globulin Ratio: 2 (ref 1.2–2.2)
Albumin: 4.6 g/dL (ref 3.7–4.7)
Alkaline Phosphatase: 74 IU/L (ref 44–121)
BUN/Creatinine Ratio: 13 (ref 12–28)
BUN: 13 mg/dL (ref 8–27)
Bilirubin Total: <0.2 mg/dL (ref 0.0–1.2)
CO2: 27 mmol/L (ref 20–29)
Calcium: 9.8 mg/dL (ref 8.7–10.3)
Chloride: 101 mmol/L (ref 96–106)
Creatinine, Ser: 1.01 mg/dL — ABNORMAL HIGH (ref 0.57–1.00)
GFR calc Af Amer: 63 mL/min/{1.73_m2} (ref >59)
GFR calc non Af Amer: 55 mL/min/{1.73_m2} — ABNORMAL LOW (ref >59)
Globulin, Total: 2.3 g/dL (ref 1.5–4.5)
Glucose: 84 mg/dL (ref 65–99)
Potassium: 4.5 mmol/L (ref 3.5–5.2)
Sodium: 139 mmol/L (ref 134–144)
Total Protein: 6.9 g/dL (ref 6.0–8.5)

## 2020-09-12 LAB — LIPID PANEL
Chol/HDL Ratio: 4.1 ratio (ref 0.0–4.4)
Cholesterol, Total: 275 mg/dL — ABNORMAL HIGH (ref 100–199)
HDL: 67 mg/dL (ref >39)
LDL Chol Calc (NIH): 195 mg/dL — ABNORMAL HIGH (ref 0–99)
Triglycerides: 81 mg/dL (ref 0–149)
VLDL Cholesterol Cal: 13 mg/dL (ref 5–40)

## 2020-09-12 LAB — TSH: TSH: 1.63 u[IU]/mL (ref 0.450–4.500)

## 2020-09-12 LAB — VITAMIN D 25 HYDROXY (VIT D DEFICIENCY, FRACTURES): Vit D, 25-Hydroxy: 37.7 ng/mL (ref 30.0–100.0)

## 2020-09-12 LAB — VITAMIN B12: Vitamin B-12: 516 pg/mL (ref 232–1245)

## 2021-02-18 ENCOUNTER — Other Ambulatory Visit: Payer: Self-pay | Admitting: Family Medicine

## 2021-02-18 DIAGNOSIS — Z1231 Encounter for screening mammogram for malignant neoplasm of breast: Secondary | ICD-10-CM

## 2021-04-17 ENCOUNTER — Ambulatory Visit: Payer: Medicare PPO

## 2021-05-29 ENCOUNTER — Other Ambulatory Visit: Payer: Self-pay

## 2021-05-29 ENCOUNTER — Ambulatory Visit
Admission: RE | Admit: 2021-05-29 | Discharge: 2021-05-29 | Disposition: A | Discharge status: Home / Self Care | Payer: Medicare PPO | Source: Ambulatory Visit | Attending: Family Medicine | Admitting: Family Medicine

## 2021-05-29 DIAGNOSIS — Z1231 Encounter for screening mammogram for malignant neoplasm of breast: Secondary | ICD-10-CM

## 2021-07-16 ENCOUNTER — Telehealth: Payer: Self-pay | Admitting: Family Medicine

## 2021-07-16 NOTE — Telephone Encounter (Signed)
LVM for pt to rtn my call to schedule AWV with [facility].  

## 2021-07-24 ENCOUNTER — Ambulatory Visit (INDEPENDENT_AMBULATORY_CARE_PROVIDER_SITE_OTHER): Payer: Medicare PPO

## 2021-07-24 DIAGNOSIS — Z0000 Encounter for general adult medical examination without abnormal findings: Secondary | ICD-10-CM

## 2021-07-24 NOTE — Progress Notes (Signed)
MEDICARE ANNUAL WELLNESS VISIT  07/24/2021  Telephone Visit Disclaimer This Medicare AWV was conducted by telephone due to national recommendations for restrictions regarding the COVID-19 Pandemic (e.g. social distancing).  I verified, using two identifiers, that I am speaking with Jackie Jackson or their authorized healthcare agent. I discussed the limitations, risks, security, and privacy concerns of performing an evaluation and management service by telephone and the potential availability of an in-person appointment in the future. The patient expressed understanding and agreed to proceed.  Location of Patient: Home Location of Provider (nurse):  [facility]  Subjective:    Jackie Jackson is a 75 y.o. female patient of Lilia Pro, Tiffany M, [place] who had a Medicare Annual Wellness Visit today via telephone. Jackie Jackson is Retired and lives with their spouse. She has two children. She reports that she is socially active and does interact with friends/family regularly. She is minimally physically active and [place]aking and working in her yard.  She has just started an exercise class and will be attending three days per week.  Patient Care Team: Gwenlyn Perking, FNP as PCP - General (Family Medicine) Huel Cote, NP (Inactive) as Nurse Practitioner (Obstetrics and Gynecology) Okey Regal, [place] (Optometry)  Advanced Directives 07/24/2021 04/19/2018 03/22/2017 03/12/2017  Does Patient Have a Medical Advance Directive? No No Yes Yes  Type of Advance Directive - - - Therapist, sports in Chart? - - No - copy requested No - copy requested  Would patient like information on creating a medical advance directive? No - Patient declined Yes (MAU/Ambulatory/Procedural Areas - Information given);No - Patient declined [facility] Utilization Over the Past 12 Months: # of hospitalizations or ER visits: 0 #  of surgeries: 0  Review of Systems    Patient reports that her overall health is unchanged compared to last year.  History obtained from chart review and the patient  Patient Reported Readings (BP, Pulse, CBG, Weight, etc) none  Pain Assessment Pain : No/denies pain     Current Medications & Allergies (verified) Allergies as of 07/24/2021       Reactions   Pantoprazole Sodium Anaphylaxis, Swelling   Protonix-tongue swelling   Statins    myalgia        Medication List        Accurate as of July 24, 2021  1:52 PM. If you have any questions, ask your nurse or doctor.          acetaminophen 500 MG tablet Commonly known as: TYLENOL Take 500 mg by mouth 2 (two) times daily as needed for mild pain or headache.   CALCIUM 1000 + D PO Take 1 tablet by mouth daily.   EPINEPHrine 0.3 mg/0.3 mL Soaj injection Commonly known as: EpiPen 2-Pak Inject 0.3 mLs (0.3 mg total) into the muscle as needed for anaphylaxis.   Fish Oil 1000 MG Caps Take by mouth.   loratadine 10 MG tablet Commonly known as: CLARITIN TAKE ONE (1) TABLET EACH DAY What changed: See the new instructions.   multivitamin with minerals Tabs tablet Take 1 tablet by mouth daily.        History (reviewed): Past Medical History:  Diagnosis Date   Acid reflux    High cholesterol    Osteopenia    2006 -1.4, 2009 -1.9, 2011 -1.9   Past Surgical History:  Procedure Laterality Date  BREAST BIOPSY  2012   CATARACT EXTRACTION W/PHACO Left 03/22/2017   Procedure: CATARACT EXTRACTION PHACO AND INTRAOCULAR LENS PLACEMENT ([facility]);  Surgeon: Tonny Branch, MD;  Location: [facility];  Service: Ophthalmology;  Laterality: Left;  CDE:  8.65   CATARACT EXTRACTION W/PHACO Right 04/05/2017   Procedure: CATARACT EXTRACTION PHACO AND INTRAOCULAR LENS PLACEMENT ([facility]);  Surgeon: Tonny Branch, MD;  Location: [facility];  Service: Ophthalmology;  Laterality: Right;  CDE:  5.23    SKIN CANCER DESTRUCTION  2012   rt. 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   Tobacco Use   Smoking status: Never   Smokeless tobacco: Never  Vaping Use   Vaping Use: Never used  Substance and Sexual Activity   Alcohol use: No   Drug use: No   Sexual activity: Not Currently  Other Topics Concern   Not on file  Social History Narrative   Not on file   Social Determinants of Health   Financial Resource Strain: Not on file  Food Insecurity: Not on file  Transportation Needs: Not on file  Physical Activity: Not on file  Stress: Not on file  Social Connections: Not on file    Activities of Daily Living In your present state of health, do you have any difficulty performing the following activities: 07/24/2021  Hearing? N  Vision? N  Difficulty concentrating or making decisions? N  Walking or climbing stairs? N  Dressing or bathing? N  Doing errands, shopping? N  Preparing Food and eating ? N  Using the Toilet? N  In the past six months, have you accidently leaked urine? N  Do you have problems with loss of bowel control? N  Managing your Medications? N  Managing your Finances? N  Housekeeping or managing your Housekeeping? N  Some recent data might be hidden    Patient Education/ Literacy How often do you need to have someone help you when you read instructions, pamphlets, or other written materials from your doctor or pharmacy?: 1 - Never What is the last grade level you completed in school?: 12th  grade  Exercise Current Exercise Habits: Structured exercise class, Type of exercise: exercise ball;stretching, Time (Minutes): 30, Frequency (Times/Week): 3, Weekly Exercise (Minutes/Week): 90, Intensity: Mild, Exercise limited by: None identified  Diet Patient reports consuming 3 meals a day and 1 snack(s) a day Patient reports that her primary diet is: Regular Patient reports that she does have regular access to food.   Depression Screen PHQ 2/9 Scores 09/11/2020 09/20/2019 12/02/2018 11/25/2018 09/29/2018 04/19/2018 11/05/2017  PHQ - 2 Score 0 0 1 0 0 0 0     Fall Risk Fall Risk  07/24/2021 09/11/2020 09/20/2019 11/25/2018 09/29/2018  Falls in the past year? 0 0 0 0 0  Follow up Falls evaluation completed - - - -  Objective:  Jackie Jackson seemed alert and oriented and she participated appropriately during our telephone visit.  Blood Pressure Weight BMI  BP Readings from Last 3 Encounters:  09/11/20 128/67  09/20/19 (!) 160/80  12/02/18 118/66   Wt Readings from Last 3 Encounters:  09/11/20 144 lb 6 oz (65.5 kg)  09/20/19 144 lb (65.3 kg)  12/02/18 147 lb 12.8 oz (67 kg)   BMI Readings from Last 1 Encounters:  09/11/20 26.41 kg/m    *Unable to obtain current vital signs, weight, and BMI due to telephone visit type  Hearing/Vision  Jackie Jackson did not seem to have difficulty with hearing/understanding during the telephone conversation Reports that she has not had a formal eye exam by an eye care professional within the past year Reports that she has not had a formal hearing evaluation within the past year *Unable to fully assess hearing and vision during telephone visit type  Cognitive Function: 6CIT Screen 07/24/2021  What Year? 0 points  What month? 0 points  What time? 0 points  Count back from 20 2 points  Months in reverse 0 points  Repeat phrase 0 points  Total Score 2   (Normal:0-7, Significant for Dysfunction: >8)  Normal Cognitive Function Screening:  Yes   Immunization & Health Maintenance Record Immunization History  Administered Date(s) Administered   Influenza, High Dose Seasonal PF 08/26/2016, 10/29/2017, 08/10/2018, 08/17/2019   Influenza-Unspecified 08/17/2019, 08/21/2020   Moderna Sars-Covid-2 Vaccination 11/02/2019, 12/01/2019, 08/21/2020   Pneumococcal Conjugate-13 10/29/2017    Health Maintenance  Topic Date Due   Hepatitis C Screening  Never done   TETANUS/TDAP  Never done   Zoster Vaccines- Shingrix (1 of 2) Never done   COLONOSCOPY (Pts 45-49yrs Insurance coverage will need to be confirmed)  Never done   Pneumonia Vaccine 92+ Years old (2 - PPSV23 if available, else PCV20) 10/29/2018   COVID-19 Vaccine (4 - Booster for Moderna series) 10/16/2020   INFLUENZA VACCINE  04/28/2021   DEXA SCAN  Completed   HPV VACCINES  Aged Out       Assessment  This is a routine wellness examination for Jackie Jackson.  Health Maintenance: Due or Overdue Health Maintenance Due  Topic Date Due   Hepatitis C Screening  Never done   TETANUS/TDAP  Never done   Zoster Vaccines- Shingrix (1 of 2) Never done   COLONOSCOPY (Pts 45-20yrs Insurance coverage will need to be confirmed)  Never done   Pneumonia Vaccine 39+ Years old (2 - PPSV23 if available, else PCV20) 10/29/2018   COVID-19 Vaccine (4 - Booster for Moderna series) 10/16/2020   INFLUENZA VACCINE  04/28/2021    Jackie Jackson does not need a referral for Community Assistance: Care Management:   no Social Work:    no Prescription Assistance:  no Nutrition/Diabetes Education:  no   Plan:  Personalized Goals  Goals Addressed             This Visit's Progress    Patient Stated       07/24/2021 AWV Goal: Fall Prevention  Over the next year, patient will decrease their risk for falls by: Using assistive devices, such as a cane or walker, as needed Identifying fall risks within their home and correcting them by: Removing throw rugs Adding handrails to stairs  or ramps Removing clutter and keeping a clear pathway throughout the home Increasing light, especially at night Adding shower handles/bars Raising toilet seat Identifying potential personal risk factors for falls: Medication side effects  Incontinence/urgency Vestibular dysfunction Hearing loss Musculoskeletal disorders Neurological disorders Orthostatic hypotension         Personalized Health Maintenance & Screening Recommendations  Pneumococcal vaccine  Influenza vaccine Td vaccine Colorectal cancer screening  Lung Cancer Screening Recommended: no (Low Dose CT Chest recommended if Age 87-80 years, 30 pack-year currently smoking OR have quit w/in past 15 years) Hepatitis C Screening recommended: yes HIV Screening recommended: no  Advanced Directives: Written information was not prepared per patient's request.  Referrals & Orders No orders of the defined types were placed in this encounter.   Follow-up Plan Follow-up with Gwenlyn Perking, FNP as planned    I have personally reviewed and noted the following in the patient's chart:   Medical and social history Use of alcohol, tobacco or illicit drugs  Current medications and supplements Functional ability and status Nutritional status Physical activity Advanced directives List of other physicians Hospitalizations, surgeries, and ER visits in previous 12 months Vitals Screenings to include cognitive, depression, and falls Referrals and appointments  In addition, I have reviewed and discussed with Jackie Jackson certain preventive protocols, quality metrics, and best practice recommendations. A written personalized care plan for preventive services as well as general preventive health recommendations is available and can be mailed to the patient at her request.      Burnadette Pop  07/24/2021

## 2021-09-15 ENCOUNTER — Ambulatory Visit: Payer: Medicare PPO | Admitting: Family Medicine

## 2021-09-15 ENCOUNTER — Encounter: Payer: Self-pay | Admitting: Family Medicine

## 2021-09-15 VITALS — BP 120/68 | HR 90 | Temp 98.3°F | Ht 62.0 in | Wt 138.2 lb

## 2021-09-15 DIAGNOSIS — Z780 Asymptomatic menopausal state: Secondary | ICD-10-CM

## 2021-09-15 DIAGNOSIS — M8580 Other specified disorders of bone density and structure, unspecified site: Secondary | ICD-10-CM

## 2021-09-15 DIAGNOSIS — E663 Overweight: Secondary | ICD-10-CM | POA: Diagnosis not present

## 2021-09-15 DIAGNOSIS — Z789 Other specified health status: Secondary | ICD-10-CM | POA: Diagnosis not present

## 2021-09-15 DIAGNOSIS — E7800 Pure hypercholesterolemia, unspecified: Secondary | ICD-10-CM

## 2021-09-15 DIAGNOSIS — J301 Allergic rhinitis due to pollen: Secondary | ICD-10-CM

## 2021-09-15 DIAGNOSIS — Z0001 Encounter for general adult medical examination with abnormal findings: Secondary | ICD-10-CM

## 2021-09-15 DIAGNOSIS — Z0000 Encounter for general adult medical examination without abnormal findings: Secondary | ICD-10-CM

## 2021-09-15 MED ORDER — LORATADINE 10 MG PO TABS: 10.0000 mg | ORAL_TABLET | Freq: Every day | ORAL | 3 refills | Status: DC

## 2021-09-15 MED ORDER — FLUTICASONE PROPIONATE 50 MCG/ACT NA SUSP: 2.0000 | Freq: Every day | NASAL | 6 refills | Status: AC

## 2021-09-15 NOTE — Progress Notes (Signed)
Jackie Jackson is a 75 y.o. female presents to office today for annual physical exam examination.    Concerns today include: 1. Postnasal drip. Jackie Jackson reports postnasal drip most morning from her allergies. She does take claritin daily for her allergies.   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   Tobacco Use   Smoking status: Never   Smokeless tobacco: Never  Vaping Use   Vaping Use: Never used  Substance and Sexual Activity   Alcohol use: No   Drug use: No   Sexual activity: Not Currently  Other Topics Concern   Not on file  Social History Narrative   Not on file   Social Determinants of Health   Financial Resource Strain: Not on file  Food Insecurity: Not on file  Transportation Needs: Not on file  Physical Activity: Not on file  Stress: Not on file  Social Connections: Not on file  Intimate Partner Violence: Not on file   Past Surgical History:  Procedure Laterality Date   BREAST BIOPSY  2012   CATARACT EXTRACTION W/PHACO Left 03/22/2017   Procedure: CATARACT EXTRACTION PHACO AND INTRAOCULAR LENS PLACEMENT ([place]);  Surgeon: Tonny Branch, MD;  Location: [facility];  Service: Ophthalmology;  Laterality: Left;  CDE:  8.65   CATARACT EXTRACTION W/PHACO Right 04/05/2017   Procedure: CATARACT EXTRACTION PHACO AND INTRAOCULAR LENS PLACEMENT ([facility]);  Surgeon: Tonny Branch, MD;  Location: [facility];  Service: Ophthalmology;  Laterality: Right;  CDE:  5.23     SKIN CANCER DESTRUCTION  2012   rt. lower leg   TUBAL LIGATION     Family History  Problem Relation Age of Onset   Diabetes Brother    Heart disease Father    Heart disease Mother    Cancer Sister        lung   Healthy Daughter    Healthy Son    Heart disease Maternal Grandmother     Current Outpatient Medications:    acetaminophen (TYLENOL) 500 MG tablet, Take 500 mg by mouth 2 (two) times daily as needed for mild pain or headache. , Disp: , Rfl:    Calcium Carb-Cholecalciferol (CALCIUM 1000 + D PO), Take 1 tablet by mouth daily., Disp: , Rfl:    EPINEPHrine (EPIPEN 2-PAK) 0.3 mg/0.3 mL IJ SOAJ injection, Inject 0.3 mLs (0.3 mg total) into the muscle as needed for anaphylaxis., Disp: 1 Device, Rfl: 2   loratadine (CLARITIN) 10 MG tablet, TAKE ONE (1) TABLET EACH DAY (Patient taking differently: Take 10 mg by mouth daily as needed.), Disp: 90 tablet, Rfl: 3   Multiple Vitamin (MULTIVITAMIN WITH MINERALS) TABS tablet, Take 1 tablet by mouth daily., Disp: , Rfl:    Omega-3 Fatty Acids (FISH OIL) 1000 MG CAPS, Take by mouth., Disp: , Rfl:   Allergies  Allergen Reactions   Pantoprazole Sodium Anaphylaxis and Swelling  Protonix-tongue swelling   Statins     myalgia     ROS: Review of Systems Pertinent items noted in HPI and remainder of comprehensive ROS otherwise negative.    Physical exam BP 120/68 Comment: at home reading   Pulse 90    Temp 98.3 F (36.8 C) (Temporal)    Ht $R'5\' 2"'NV$  (1.575 m)    Wt 138 lb 4 oz (62.7 kg)    LMP 08/04/1997    BMI 25.29 kg/m  General appearance: alert, cooperative, and no distress Head: Normocephalic, without obvious abnormality, atraumatic Eyes: conjunctivae/corneas clear. PERRL, EOM's intact.  Ears: normal TM's and external ear canals both ears Nose: Nares normal. Septum midline. Mucosa normal. No drainage or sinus tenderness. Throat: lips, mucosa, and tongue normal; teeth and gums normal Neck: no adenopathy, no carotid bruit, no JVD,  supple, symmetrical, trachea midline, and thyroid not enlarged, symmetric, no tenderness/mass/nodules Lungs: clear to auscultation bilaterally Heart: regular rate and rhythm, S1, S2 normal, no murmur, click, rub or gallop Abdomen: soft, non-tender; bowel sounds normal; no masses,  no organomegaly Extremities: extremities normal, atraumatic, no cyanosis or edema Skin: Skin color, texture, turgor normal. No rashes or lesions Neurologic: Alert and oriented X 3, normal strength and tone. Normal symmetric reflexes. Normal coordination and gait    Assessment/ Plan: Jackie Jackson here for annual physical exam.   Jackie Jackson was seen today for annual exam.  Diagnoses and all orders for this visit:  Routine general medical examination at a health care facility She will return for fasting labs as below.  -     CBC with Differential/Platelet; Future -     CMP14+EGFR; Future -     Lipid panel; Future -     TSH; Future  Pure hypercholesterolemia Diet and exercise. Statin intolerant. Labs pending.  -     CBC with Differential/Platelet; Future -     CMP14+EGFR; Future -     Lipid panel; Future  Statin intolerance  Overweight (BMI 25.0-29.9) Diet and exercise. Labs pending.  -     CBC with Differential/Platelet; Future -     CMP14+EGFR; Future -     Lipid panel; Future -     TSH; Future  Osteopenia, unspecified location Postmenopausal On Vit D and Calcium supplement. She will return for DEXA scan.  -     DG [facility] DEXA; Future  Chronic seasonal allergic rhinitis due to pollen Continue claritin. Add flonase.  -     loratadine (CLARITIN) 10 MG tablet; Take 1 tablet (10 mg total) by mouth daily. TAKE ONE (1) TABLET EACH DAY Strength: 10 mg -     fluticasone (FLONASE) 50 MCG/ACT nasal spray; Place 2 sprays into both nostrils daily.   Counseled on healthy lifestyle choices, including diet (rich in fruits, vegetables and lean meats and low in salt and simple carbohydrates) and exercise (at  least 30 minutes of moderate physical activity daily).  Patient to follow up in 1 year for annual exam or sooner if needed.  The above assessment and management plan was discussed with the patient. The patient verbalized understanding of and has agreed to the management plan. Patient is aware to call the clinic if symptoms persist or worsen. Patient is aware when to return to the clinic for a follow-up visit. Patient educated on when it is appropriate to go to the emergency department.   Jackie Smolder, FNP-C [facility] [address] [place], [place] [address] [phone]

## 2021-09-15 NOTE — Patient Instructions (Signed)

## 2021-09-24 ENCOUNTER — Ambulatory Visit (INDEPENDENT_AMBULATORY_CARE_PROVIDER_SITE_OTHER): Payer: Medicare PPO

## 2021-09-24 ENCOUNTER — Other Ambulatory Visit: Payer: Self-pay

## 2021-09-24 ENCOUNTER — Other Ambulatory Visit: Payer: Medicare PPO

## 2021-09-24 DIAGNOSIS — E7800 Pure hypercholesterolemia, unspecified: Secondary | ICD-10-CM | POA: Diagnosis not present

## 2021-09-24 DIAGNOSIS — M8580 Other specified disorders of bone density and structure, unspecified site: Secondary | ICD-10-CM

## 2021-09-24 DIAGNOSIS — E663 Overweight: Secondary | ICD-10-CM | POA: Diagnosis not present

## 2021-09-24 DIAGNOSIS — Z0000 Encounter for general adult medical examination without abnormal findings: Secondary | ICD-10-CM | POA: Diagnosis not present

## 2021-09-24 DIAGNOSIS — Z780 Asymptomatic menopausal state: Secondary | ICD-10-CM

## 2021-09-24 DIAGNOSIS — M8588 Other specified disorders of bone density and structure, other site: Secondary | ICD-10-CM

## 2021-09-24 DIAGNOSIS — M8589 Other specified disorders of bone density and structure, multiple sites: Secondary | ICD-10-CM | POA: Diagnosis not present

## 2021-09-25 LAB — CMP14+EGFR
ALT: 14 IU/L (ref 0–32)
AST: 18 IU/L (ref 0–40)
Albumin/Globulin Ratio: 2.4 — ABNORMAL HIGH (ref 1.2–2.2)
Albumin: 4.7 g/dL (ref 3.7–4.7)
Alkaline Phosphatase: 83 IU/L (ref 44–121)
BUN/Creatinine Ratio: 15 (ref 12–28)
BUN: 13 mg/dL (ref 8–27)
Bilirubin Total: 0.3 mg/dL (ref 0.0–1.2)
CO2: 24 mmol/L (ref 20–29)
Calcium: 9.6 mg/dL (ref 8.7–10.3)
Chloride: 103 mmol/L (ref 96–106)
Creatinine, Ser: 0.86 mg/dL (ref 0.57–1.00)
Globulin, Total: 2 g/dL (ref 1.5–4.5)
Glucose: 102 mg/dL — ABNORMAL HIGH (ref 70–99)
Potassium: 4.5 mmol/L (ref 3.5–5.2)
Sodium: 139 mmol/L (ref 134–144)
Total Protein: 6.7 g/dL (ref 6.0–8.5)
eGFR: 70 mL/min/{1.73_m2} (ref >59)

## 2021-09-25 LAB — CBC WITH DIFFERENTIAL/PLATELET
Basophils Absolute: 0.1 10*3/uL (ref 0.0–0.2)
Basos: 1 %
EOS (ABSOLUTE): 0.1 10*3/uL (ref 0.0–0.4)
Eos: 2 %
Hematocrit: 39.5 % (ref 34.0–46.6)
Hemoglobin: 13.2 g/dL (ref 11.1–15.9)
Immature Grans (Abs): 0 10*3/uL (ref 0.0–0.1)
Immature Granulocytes: 0 %
Lymphocytes Absolute: 2.2 10*3/uL (ref 0.7–3.1)
Lymphs: 41 %
MCH: 30.5 pg (ref 26.6–33.0)
MCHC: 33.4 g/dL (ref 31.5–35.7)
MCV: 91 fL (ref 79–97)
Monocytes Absolute: 0.3 10*3/uL (ref 0.1–0.9)
Monocytes: 6 %
Neutrophils Absolute: 2.6 10*3/uL (ref 1.4–7.0)
Neutrophils: 50 %
Platelets: 276 10*3/uL (ref 150–450)
RBC: 4.33 x10E6/uL (ref 3.77–5.28)
RDW: 12.7 % (ref 11.7–15.4)
WBC: 5.3 10*3/uL (ref 3.4–10.8)

## 2021-09-25 LAB — LIPID PANEL
Chol/HDL Ratio: 4.4 ratio (ref 0.0–4.4)
Cholesterol, Total: 279 mg/dL — ABNORMAL HIGH (ref 100–199)
HDL: 63 mg/dL (ref >39)
LDL Chol Calc (NIH): 199 mg/dL — ABNORMAL HIGH (ref 0–99)
Triglycerides: 98 mg/dL (ref 0–149)
VLDL Cholesterol Cal: 17 mg/dL (ref 5–40)

## 2021-09-25 LAB — TSH: TSH: 2.49 u[IU]/mL (ref 0.450–4.500)

## 2021-10-01 ENCOUNTER — Telehealth: Payer: Self-pay | Admitting: Family Medicine

## 2021-10-01 DIAGNOSIS — E7800 Pure hypercholesterolemia, unspecified: Secondary | ICD-10-CM

## 2021-10-01 MED ORDER — EZETIMIBE 10 MG PO TABS: 10.0000 mg | ORAL_TABLET | Freq: Every day | ORAL | 2 refills | Status: DC

## 2021-10-01 NOTE — Telephone Encounter (Signed)
Patient states she would like to try Zetia - send to drug store.

## 2021-10-01 NOTE — Telephone Encounter (Signed)
Rx'd statin.

## 2021-11-27 ENCOUNTER — Other Ambulatory Visit: Payer: Self-pay | Admitting: Family Medicine

## 2021-11-27 DIAGNOSIS — E7800 Pure hypercholesterolemia, unspecified: Secondary | ICD-10-CM

## 2022-02-26 ENCOUNTER — Other Ambulatory Visit: Payer: Self-pay | Admitting: Family Medicine

## 2022-02-26 DIAGNOSIS — E7800 Pure hypercholesterolemia, unspecified: Secondary | ICD-10-CM

## 2022-03-02 ENCOUNTER — Encounter: Payer: Self-pay | Admitting: Family Medicine

## 2022-03-02 ENCOUNTER — Ambulatory Visit: Payer: Medicare PPO | Admitting: Family Medicine

## 2022-03-02 VITALS — BP 155/77 | HR 67 | Temp 96.8°F | Ht 62.0 in | Wt 132.8 lb

## 2022-03-02 DIAGNOSIS — Z87892 Personal history of anaphylaxis: Secondary | ICD-10-CM

## 2022-03-02 DIAGNOSIS — J301 Allergic rhinitis due to pollen: Secondary | ICD-10-CM | POA: Diagnosis not present

## 2022-03-02 DIAGNOSIS — E7800 Pure hypercholesterolemia, unspecified: Secondary | ICD-10-CM

## 2022-03-02 DIAGNOSIS — L299 Pruritus, unspecified: Secondary | ICD-10-CM

## 2022-03-02 DIAGNOSIS — R030 Elevated blood-pressure reading, without diagnosis of hypertension: Secondary | ICD-10-CM | POA: Diagnosis not present

## 2022-03-02 DIAGNOSIS — R220 Localized swelling, mass and lump, head: Secondary | ICD-10-CM | POA: Diagnosis not present

## 2022-03-02 MED ORDER — EPINEPHRINE 0.3 MG/0.3ML IJ SOAJ: 0.3000 mg | INTRAMUSCULAR | 2 refills | Status: DC | PRN

## 2022-03-02 NOTE — Patient Instructions (Signed)
Pruritus Pruritus is an itchy feeling on the skin. One of the most common causes is dry skin, but many different things can cause itching. Most cases of itching do not require medical attention. Sometimes itchy skin can turn into a rash. Follow these instructions at home: Skin care  Apply moisturizing lotion to your skin as needed. Lotion that contains petroleum jelly is best. Take medicines or apply medicated creams only as told by your health care provider. This may include: Corticosteroid cream. Anti-itch lotions. Oral antihistamines. Apply a cool, wet cloth (cool compress) to the affected areas. Take baths with one of the following: Epsom salts. You can get these at your local pharmacy or grocery store. Follow the instructions on the packaging. Baking soda. Pour a small amount into the bath as told by your health care provider. Colloidal oatmeal. You can get this at your local pharmacy or grocery store. Follow the instructions on the packaging. Apply baking soda paste to your skin. To make the paste, stir water into a small amount of baking soda until it reaches a paste-like consistency. Do not scratch your skin. Do not take hot showers or baths, which can make itching worse. A cool shower may help with itching as long as you apply moisturizing lotion after the shower. Do not use scented soaps, detergents, perfumes, and cosmetic products. Instead, use gentle, unscented versions of these items. General instructions Avoid wearing tight clothes. Keep a journal to help find out what is causing your itching. Write down: What you eat and drink. What cosmetic products you use. What soaps or detergents you use. What you wear, including jewelry. Use a humidifier. This keeps the air moist, which helps to prevent dry skin. Be aware of any changes in your itchiness. Contact a health care provider if: The itching does not go away after several days. You are unusually thirsty or urinating more  than normal. Your skin tingles or feels numb. Your skin or the white parts of your eyes turn yellow (jaundice). You feel weak. You have any of the following: Night sweats. Tiredness (fatigue). Weight loss. Abdominal pain. Summary Pruritus is an itchy feeling on the skin. One of the most common causes is dry skin, but many different conditions and factors can cause itching. Apply moisturizing lotion to your skin as needed. Lotion that contains petroleum jelly is best. Take medicines or apply medicated creams only as told by your health care provider. Do not take hot showers or baths. Do not use scented soaps, detergents, perfumes, or cosmetic products. This information is not intended to replace advice given to you by your health care provider. Make sure you discuss any questions you have with your health care provider. Document Revised: 06/30/2021 Document Reviewed: 06/30/2021 Elsevier Patient Education  2022 [employer]  

## 2022-03-02 NOTE — Progress Notes (Signed)
Acute Office Visit  Subjective:     Patient ID: Jackie Jackson, female    DOB: 11/07/1945, 76 y.o.   MRN: [id number]  Chief Complaint  Patient presents with   Allergic Reaction    To ezetimibe - states has lips swelling and itching all over.     Allergic Reaction  Patient is in today for itching for the last few months. Occurs intermittently. It is worse at night. No rash. She reports that it itching started when she started zetia.   She also reports that her lips were swollen and tender last week. She did not having any swelling of her tongue or throat. Denies shortness of breath or wheezing. She does have a history of seasonal allergies and has been taking claritin and flonase intermittently. She has had an anaphylactic reaction in the past to protonix. She used to have an epi pen on hand but this has expired. Denies new food or products. She denies new medication other than zetia.   She checks her BP frequently at home and it is always 120s/70s.  ROS Negative unless specially indicated above in HPI.     Objective:    BP (!) 155/77   Pulse 67   Temp (!) 96.8 F (36 C) (Temporal)   Ht '5\' 2"'$  (1.575 m)   Wt 132 lb 12.8 oz (60.2 kg)   LMP 08/04/1997   SpO2 99%   BMI 24.29 kg/m  BP Readings from Last 3 Encounters:  03/02/22 (!) 155/77  09/15/21 120/68  09/11/20 128/67      Physical Exam Vitals and nursing note reviewed.  Constitutional:      General: She is not in acute distress.    Appearance: She is not ill-appearing, toxic-appearing or diaphoretic.  HENT:     Nose: Nose normal.     Mouth/Throat:     Lips: Pink. No lesions.     Mouth: Mucous membranes are moist. No angioedema.     Pharynx: Oropharynx is clear.  Cardiovascular:     Rate and Rhythm: Normal rate and regular rhythm.     Heart sounds: Normal heart sounds. No murmur heard. Pulmonary:     Effort: Pulmonary effort is normal. No respiratory distress.     Breath sounds: Normal breath sounds.   Musculoskeletal:     Right lower leg: No edema.     Left lower leg: No edema.  Skin:    General: Skin is warm and dry.     Coloration: Skin is not jaundiced.     Findings: No erythema or rash.  Neurological:     General: No focal deficit present.     Mental Status: She is alert and oriented to person, place, and time.  Psychiatric:        Mood and Affect: Mood normal.        Behavior: Behavior normal.        Thought Content: Thought content normal.        Judgment: Judgment normal.    No results found for any visits on 03/02/22.      Assessment & Plan:   Jackie Jackson was seen today for allergic reaction.  Diagnoses and all orders for this visit:  Pruritus Reports started with zetia 6 months ago. Will discontinue zetia and see if this resolves. Discussed referral to allergy if no improvement. Continue claritin for itching.   Swelling of upper lip Unsure of cause. Discussed referral to allergy if symptoms continue. Will refill epi pen and discussed  when to seek emergency care.   Chronic seasonal allergic rhinitis due to pollen Continue claritin and flonase.   History of anaphylaxis -     EPINEPHrine (EPIPEN 2-PAK) 0.3 mg/0.3 mL IJ SOAJ injection; Inject 0.3 mg into the muscle as needed for anaphylaxis.  Pure hypercholesterolemia Will discontinue zetia for possible side effects. Discussed diet. Will follow up in 8 weeks to recheck labs.   Blood pressure elevated without history of HTN Well controlled at home. Continue to monitor BP and notify for elevated readings.    Return in about 2 months (around 05/02/2022) for cholesterol.  The patient indicates understanding of these issues and agrees with the plan.   Gwenlyn Perking, FNP

## 2022-04-22 ENCOUNTER — Encounter: Payer: Self-pay | Admitting: Family Medicine

## 2022-04-22 ENCOUNTER — Ambulatory Visit: Payer: Medicare PPO | Admitting: Family Medicine

## 2022-04-22 VITALS — BP 127/63 | HR 66 | Temp 98.2°F | Ht 62.0 in | Wt 131.1 lb

## 2022-04-22 DIAGNOSIS — N644 Mastodynia: Secondary | ICD-10-CM

## 2022-04-22 DIAGNOSIS — L298 Other pruritus: Secondary | ICD-10-CM | POA: Diagnosis not present

## 2022-04-22 DIAGNOSIS — L2989 Other pruritus: Secondary | ICD-10-CM

## 2022-04-22 DIAGNOSIS — E785 Hyperlipidemia, unspecified: Secondary | ICD-10-CM | POA: Diagnosis not present

## 2022-04-22 DIAGNOSIS — R220 Localized swelling, mass and lump, head: Secondary | ICD-10-CM | POA: Diagnosis not present

## 2022-04-22 LAB — CBC WITH DIFFERENTIAL/PLATELET
Basophils Absolute: 0.1 10*3/uL (ref 0.0–0.2)
Basos: 1 %
EOS (ABSOLUTE): 0.1 10*3/uL (ref 0.0–0.4)
Eos: 1 %
Hematocrit: 38.5 % (ref 34.0–46.6)
Hemoglobin: 12.6 g/dL (ref 11.1–15.9)
Immature Grans (Abs): 0 10*3/uL (ref 0.0–0.1)
Immature Granulocytes: 0 %
Lymphocytes Absolute: 2.2 10*3/uL (ref 0.7–3.1)
Lymphs: 32 %
MCH: 30.1 pg (ref 26.6–33.0)
MCHC: 32.7 g/dL (ref 31.5–35.7)
MCV: 92 fL (ref 79–97)
Monocytes Absolute: 0.4 10*3/uL (ref 0.1–0.9)
Monocytes: 6 %
Neutrophils Absolute: 4.1 10*3/uL (ref 1.4–7.0)
Neutrophils: 60 %
Platelets: 266 10*3/uL (ref 150–450)
RBC: 4.19 x10E6/uL (ref 3.77–5.28)
RDW: 12.7 % (ref 11.7–15.4)
WBC: 6.8 10*3/uL (ref 3.4–10.8)

## 2022-04-22 LAB — CMP14+EGFR
ALT: 13 IU/L (ref 0–32)
AST: 18 IU/L (ref 0–40)
Albumin/Globulin Ratio: 2.5 — ABNORMAL HIGH (ref 1.2–2.2)
Albumin: 4.7 g/dL (ref 3.8–4.8)
Alkaline Phosphatase: 84 IU/L (ref 44–121)
BUN/Creatinine Ratio: 9 — ABNORMAL LOW (ref 12–28)
BUN: 8 mg/dL (ref 8–27)
Bilirubin Total: 0.3 mg/dL (ref 0.0–1.2)
CO2: 24 mmol/L (ref 20–29)
Calcium: 9.6 mg/dL (ref 8.7–10.3)
Chloride: 102 mmol/L (ref 96–106)
Creatinine, Ser: 0.89 mg/dL (ref 0.57–1.00)
Globulin, Total: 1.9 g/dL (ref 1.5–4.5)
Glucose: 96 mg/dL (ref 70–99)
Potassium: 4.7 mmol/L (ref 3.5–5.2)
Sodium: 139 mmol/L (ref 134–144)
Total Protein: 6.6 g/dL (ref 6.0–8.5)
eGFR: 67 mL/min/{1.73_m2} (ref >59)

## 2022-04-22 LAB — LIPID PANEL
Chol/HDL Ratio: 3.8 ratio (ref 0.0–4.4)
Cholesterol, Total: 260 mg/dL — ABNORMAL HIGH (ref 100–199)
HDL: 69 mg/dL (ref >39)
LDL Chol Calc (NIH): 177 mg/dL — ABNORMAL HIGH (ref 0–99)
Triglycerides: 82 mg/dL (ref 0–149)
VLDL Cholesterol Cal: 14 mg/dL (ref 5–40)

## 2022-04-22 NOTE — Progress Notes (Signed)
Established Patient Office Visit  Subjective   Patient ID: Jackie Jackson, female    DOB: [birth year]  Age: 76 y.o. MRN: [id number]  Chief Complaint  Patient presents with   Breast Pain    HPI Jackie Jackson is here for pain in her right nipple x 3-4 weeks. She now has pain in her right lower breast. She denies nipple retraction or discharge, changes in the skin, or masses. She had a normal mammogram last year. She has had some asymmetry that was noted in the left breast in the past and she has a clip in this area. No family history of breast cancer.   She continues to have intermittent swelling in her lips and itching with a rash in her lower legs. She has been using some steroid cream without resolution. She would like to see an allergiest. She has not been keep a food diary. Denies new medications or products. Denies shortness of breath or wheezing.   Past Medical History:  Diagnosis Date   Acid reflux    High cholesterol    Osteopenia    2006 -1.4, 2009 -1.9, 2011 -1.9      ROS As per HPI.    Objective:     BP 127/63   Pulse 66   Temp 98.2 F (36.8 C) (Temporal)   Ht _0  (1.575 m)   Wt 131 lb 2 oz (59.5 kg)   LMP 08/04/1997   SpO2 98%   BMI 23.98 kg/m    Physical Exam Vitals and nursing note reviewed.  Constitutional:      General: She is not in acute distress.    Appearance: She is not ill-appearing, toxic-appearing or diaphoretic.  Cardiovascular:     Rate and Rhythm: Normal rate and regular rhythm.     Heart sounds: Normal heart sounds. No murmur heard. Pulmonary:     Effort: Pulmonary effort is normal. No respiratory distress.     Breath sounds: Normal breath sounds.  Chest:  Breasts:    Breasts are symmetrical.     Right: No swelling, bleeding, inverted nipple, mass, nipple discharge, skin change or tenderness.     Left: No swelling, bleeding, inverted nipple, mass (lower central), nipple discharge, skin change or tenderness.  Lymphadenopathy:     Upper  Body:     Right upper body: No axillary or pectoral adenopathy.     Left upper body: No axillary or pectoral adenopathy.  Skin:    General: Skin is warm and dry.     Findings: Rash present. Rash is papular (bilateral lower leg, no exudate or erythema).  Neurological:     General: No focal deficit present.     Mental Status: She is alert and oriented to person, place, and time.  Psychiatric:        Mood and Affect: Mood normal.        Behavior: Behavior normal.      No results found for any visits on 04/22/22.    The 10-year ASCVD risk score (Arnett DK, et al., 2019) is: 18%    Assessment & Plan:   Jackie Jackson was seen today for breast pain.  Diagnoses and all orders for this visit:  Breast pain Nipple pain Right x 3-4 weeks. Benign exam. Imaging ordered as below for further evaluation.  -     Cancel: MM Digital Diagnostic Bilat; Future -     US BREAST LTD UNI LEFT INC AXILLA; Future -     US BREAST LTD UNI RIGHT  INC AXILLA; Future -     MM DIAG BREAST TOMO UNI RIGHT; Future  Chronic pruritic rash in adult Swelling of both lips Referral placed.  -     Ambulatory referral to Allergy  Hyperlipidemia, unspecified hyperlipidemia type Will recheck cholesterol today. Diet and exercise.  -     CBC with Differential/Platelet -     CMP14+EGFR -     Lipid panel  Return in about 6 months (around 10/23/2022) for CPE.   The patient indicates understanding of these issues and agrees with the plan.  Gwenlyn Perking, FNP

## 2022-04-22 NOTE — Patient Instructions (Signed)
[facility] [address] [place], [place] [address]

## 2022-04-23 ENCOUNTER — Other Ambulatory Visit: Payer: Self-pay | Admitting: Family Medicine

## 2022-04-23 DIAGNOSIS — Z789 Other specified health status: Secondary | ICD-10-CM

## 2022-04-23 DIAGNOSIS — E785 Hyperlipidemia, unspecified: Secondary | ICD-10-CM

## 2022-04-23 MED ORDER — EZETIMIBE 10 MG PO TABS: 10.0000 mg | ORAL_TABLET | Freq: Every day | ORAL | 3 refills | Status: DC

## 2022-04-28 ENCOUNTER — Ambulatory Visit
Admission: RE | Admit: 2022-04-28 | Discharge: 2022-04-28 | Disposition: A | Discharge status: Home / Self Care | Payer: Medicare PPO | Source: Ambulatory Visit | Attending: Family Medicine | Admitting: Family Medicine

## 2022-04-28 ENCOUNTER — Ambulatory Visit: Payer: Medicare PPO

## 2022-04-28 ENCOUNTER — Other Ambulatory Visit: Payer: Self-pay | Admitting: Family Medicine

## 2022-04-28 DIAGNOSIS — N644 Mastodynia: Secondary | ICD-10-CM

## 2022-05-18 ENCOUNTER — Ambulatory Visit: Payer: Medicare PPO | Admitting: Allergy & Immunology

## 2022-05-18 ENCOUNTER — Encounter: Payer: Self-pay | Admitting: Allergy & Immunology

## 2022-05-18 VITALS — BP 132/66 | HR 72 | Temp 98.2°F | Resp 16 | Ht 61.5 in | Wt 132.0 lb

## 2022-05-18 DIAGNOSIS — R220 Localized swelling, mass and lump, head: Secondary | ICD-10-CM | POA: Diagnosis not present

## 2022-05-18 DIAGNOSIS — J310 Chronic rhinitis: Secondary | ICD-10-CM | POA: Diagnosis not present

## 2022-05-18 DIAGNOSIS — R682 Dry mouth, unspecified: Secondary | ICD-10-CM | POA: Diagnosis not present

## 2022-05-18 MED ORDER — TRIAMCINOLONE ACETONIDE 0.1 % MT PSTE: 1.0000 | PASTE | Freq: Two times a day (BID) | OROMUCOSAL | 5 refills | Status: DC

## 2022-05-18 MED ORDER — HYDROCORTISONE 2.5 % EX CREA
TOPICAL_CREAM | Freq: Two times a day (BID) | CUTANEOUS | 5 refills | Status: DC
Start: 2022-05-18 — End: 2022-09-02

## 2022-05-18 NOTE — Patient Instructions (Addendum)
1. Lip swelling/burning - I am going to get some labs to look for a disease called hereditary angioedema (I think that this is going to be negative). - We are going to look into tumeric and mustard IgE (allergy) levels.  - We will call you in 1-2 weeks with the results of the testing. - EpiPen is up to date.  - Add on hydrocortisone 2.5% that you can use on the face. - Add on triamcinolone orabase to use on the lesions INSIDE of the mouth.   2. Dry mouth - I am going to look into some autoimmune diseases due to the dry mouth.  - Hold your mouthwash since the mouth dryness is not any worse without it.   3. Chronic rhinitis - We are going to get some environmental allergies via the blood since we are getting blood work anyway. - We will call you in 1-2 weeks with the results of the testing.   4. Return in about 6 weeks (around 06/29/2022).    Please inform us of any Emergency Department visits, hospitalizations, or changes in symptoms. Call us before going to the ED for breathing or allergy symptoms since we might be able to fit you in for a sick visit. Feel free to contact us anytime with any questions, problems, or concerns.  It was a pleasure to meet you today!  Websites that have reliable patient information: 1. American Academy of Asthma, Allergy, and Immunology: www.aaaai.org 2. Food Allergy Research and Education (FARE): foodallergy.org 3. Mothers of Asthmatics: http://www.asthmacommunitynetwork.org 4. American College of Allergy, Asthma, and Immunology: www.acaai.org   COVID-19 Vaccine Information can be found at: ShippingScam.co.uk For questions related to vaccine distribution or appointments, please email [email or web address] or call [phone].   We realize that you might be concerned about having an allergic reaction to the COVID19 vaccines. To help with that concern, WE ARE OFFERING THE COVID19 VACCINES IN OUR  OFFICE! Ask the front desk for dates!     "Like" [place] on Facebook and Instagram for our latest updates!      A healthy democracy works best when [employer] participate! Make sure you are registered to vote! If you have moved or changed any of your contact information, you will need to get this updated before voting!  In some cases, you MAY be able to register to vote online: [email or web address]

## 2022-05-18 NOTE — Progress Notes (Signed)
NEW PATIENT  Date of Service/Encounter:  05/18/22  Consult requested by: Jackie Perking, FNP   Assessment:   Lip swelling - checking IgE for tumeric and mustard as well as HAE labs  Dry mouth - checking ANA and inflammatory markers  Chronic rhinitis - checking environmental allergy panel  Plan/Recommendations:    1. Lip swelling/burning - I am going to get some labs to look for a disease called hereditary angioedema (I think that this is going to be negative). - We are going to look into tumeric and mustard IgE (allergy) levels.  - We will call you in 1-2 weeks with the results of the testing. - EpiPen is up to date.  - Add on hydrocortisone 2.5% that you can use on the face. - Add on triamcinolone orabase to use on the lesions INSIDE of the mouth.   2. Dry mouth - I am going to look into some autoimmune diseases due to the dry mouth.  - Hold your mouthwash since the mouth dryness is not any worse without it.   3. Chronic rhinitis - We are going to get some environmental allergies via the blood since we are getting blood work anyway. - We will call you in 1-2 weeks with the results of the testing.   4. Return in about 6 weeks (around 06/29/2022).   This note in its entirety was forwarded to the Provider who requested this consultation.  Subjective:   Jackie Jackson is a 76 y.o. female presenting today for evaluation of  Chief Complaint  Patient presents with   Angioedema    Swelling of lips on outside it burns. When using toothpaste it burns. Has stopped using chapstick and has not been using it for the past month.  Has cut off mustard/spicy foods    Rash    Rash on arms and legs and is itchy    Other    Has constant sinus drainage.     Jackie Jackson has a history of the following: Patient Active Problem List   Diagnosis Date Noted   Overweight (BMI 25.0-29.9) 09/15/2021   Statin intolerance 12/04/2018   Abnormal rhythmic movement of tongue 11/25/2018    Chronic seasonal allergic rhinitis due to pollen 11/05/2017   Cancer ([place])    Osteopenia    Gastroesophageal reflux disease without esophagitis    Pure hypercholesterolemia     History obtained from: chart review and patient.  Jackie Jackson was referred by Jackie Perking, FNP.     Jackie Jackson is a 76 y.o. female presenting for an evaluation of swelling and itching .  She started having lip swelling end of May or beginning of June 2023. The first episode occurred with a hot dog with mustard with mustard on it. She had two hot dogs that night and it happened both times. She does report some burning and redness on her lips. She does take Benadryl with improvement in her symptoms. She has had trouble with her tongue swelling, which started occurring well before the onset of the lip swelling.   She has some blisters on the inside of her lips and she reports a lot of burning above and below her lips. They still burn. She has vaseline on them to keep them moist. She does have a history of dry mouth diagnosed by her dentist. She uses some a mouthwash for dry mouth. She started using the mouthwash around 6 months ago. She did hold it for 3 weeks and the  swelling and burning continues. She has been breaking out in a rash on her arms and legs. This is getting better overall.   She has has used some OTC allergy itching cream OTC and it does work. She does not have it with her and she does not know what is causing this.   She has been on ranitidine in the past for ten years. She stopped the Zantac two years ago. She has no hematuria and she is up to date on her colonoscopies.   She moved her toothpastes around and switched them back and forth, but it burns her lips no matter what. She has not used the mouthwash from the dentist in 3 weeks. She has had issues with [employer] sauce.      She did try stopping the lipstick, but this did not help at all. She does use chapstick and vaseline on her lips at  this very time. She buys whatever brands are on sale for chapstick. It is never consistent.    She previously worked for the [employer]. 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   Otherwise, there is no history of other atopic diseases, including drug allergies, stinging insect allergies, eczema, urticaria, or contact dermatitis. There is no significant infectious history. Vaccinations are up to date.    Past Medical History: Patient Active Problem List   Diagnosis Date Noted   Overweight (BMI 25.0-29.9) 09/15/2021   Statin intolerance 12/04/2018   Abnormal rhythmic movement of tongue 11/25/2018   Chronic seasonal allergic rhinitis due to pollen 11/05/2017   Cancer ([place])    Osteopenia    Gastroesophageal reflux disease without esophagitis    Pure hypercholesterolemia     Medication List:  Allergies as of 05/18/2022       Reactions   Pantoprazole Sodium Anaphylaxis, Swelling   Protonix-tongue swelling   Statins    myalgia        Medication List        Accurate as of May 18, 2022 10:32 PM. If you have any questions, ask your nurse or doctor.          acetaminophen 500 MG tablet Commonly known as: TYLENOL Take 500 mg by mouth 2 (two) times daily as needed for mild pain or headache.   CALCIUM 1000 + D PO Take 1 tablet by mouth daily.   EPINEPHrine 0.3 mg/0.3 mL Soaj injection Commonly known as: EpiPen 2-Pak Inject 0.3 mg into the muscle as needed for anaphylaxis.   ezetimibe 10 MG tablet Commonly known as: Zetia Take 1 tablet (10 mg total) by mouth daily.   Fish Oil 1000 MG Caps Take by mouth.   fluticasone 50 MCG/ACT nasal spray Commonly known as: FLONASE Place 2 sprays into both nostrils daily.   hydrocortisone 2.5 % cream Apply topically 2 (two) times daily. Started by: Valentina Shaggy, MD   loratadine 10 MG tablet Commonly known as: CLARITIN Take 1  tablet (10 mg total) by mouth daily. TAKE ONE (1) TABLET EACH DAY Strength: 10 mg   multivitamin with minerals Tabs tablet Take 1 tablet by mouth daily.   triamcinolone 0.1 % paste Commonly known as: KENALOG Use as directed 1 Application in the mouth or throat 2 (two) times daily. Started by: Valentina Shaggy, MD        Birth History: non-contributory  Developmental History: non-contributory  Past Surgical History: Past Surgical  History:  Procedure Laterality Date   BREAST BIOPSY  2012   CATARACT EXTRACTION W/PHACO Left 03/22/2017   Procedure: CATARACT EXTRACTION PHACO AND INTRAOCULAR LENS PLACEMENT (IOC);  Surgeon: Tonny Branch, MD;  Location: [facility];  Service: Ophthalmology;  Laterality: Left;  CDE:  8.65   CATARACT EXTRACTION W/PHACO Right 04/05/2017   Procedure: CATARACT EXTRACTION PHACO AND INTRAOCULAR LENS PLACEMENT (IOC);  Surgeon: Tonny Branch, MD;  Location: [facility];  Service: Ophthalmology;  Laterality: Right;  CDE:  5.23    SKIN CANCER DESTRUCTION  2012   rt. lower leg   TUBAL LIGATION       Family History: Family History  Problem Relation Age of Onset   Diabetes Brother    Heart disease Father    Heart disease Mother    Cancer Sister        lung   Healthy Daughter    Healthy Son    Heart disease Maternal Grandmother      Social History: Vonzella lives at home with her family. She lives in a house that is 76 years old. There is wood flooring throughout the home. There is gas heating and heat pumps. There is dog inside of the home. There are no roaches in the home. There are dust mite coverings on the bedding. There is no tobacco exposure. She is currently retired. There are no exposures to chemicals, dust, or fumes.    Review of Systems  Constitutional: Negative.  Negative for chills, fever, malaise/fatigue and weight loss.  HENT: Negative.  Negative for congestion, ear discharge, ear pain and sinus pain.   Eyes:  Negative for pain, discharge and redness.   Respiratory:  Negative for cough, sputum production, shortness of breath and wheezing.   Cardiovascular: Negative.  Negative for chest pain and palpitations.  Gastrointestinal:  Negative for abdominal pain, constipation, diarrhea, heartburn, nausea and vomiting.  Skin:  Positive for itching and rash.  Neurological:  Negative for dizziness and headaches.  Endo/Heme/Allergies:  Negative for environmental allergies. Does not bruise/bleed easily.       Objective:   Blood pressure 132/66, pulse 72, temperature 98.2 F (36.8 C), resp. rate 16, height 5' 1.5" (1.562 m), weight 132 lb (59.9 kg), last menstrual period 08/04/1997, SpO2 97 %. Body mass index is 24.54 kg/m.     Physical Exam Vitals reviewed.  Constitutional:      Appearance: She is well-developed.  HENT:     Head: Normocephalic and atraumatic.     Right Ear: Tympanic membrane, ear canal and external ear normal. No drainage, swelling or tenderness. Tympanic membrane is not injected, scarred, erythematous, retracted or bulging.     Left Ear: Tympanic membrane, ear canal and external ear normal. No drainage, swelling or tenderness. Tympanic membrane is not injected, scarred, erythematous, retracted or bulging.     Nose: No nasal deformity, septal deviation, mucosal edema or rhinorrhea.     Right Turbinates: Enlarged, swollen and pale.     Left Turbinates: Enlarged, swollen and pale.     Right Sinus: No maxillary sinus tenderness or frontal sinus tenderness.     Left Sinus: No maxillary sinus tenderness or frontal sinus tenderness.     Mouth/Throat:     Mouth: Mucous membranes are not pale and not dry.     Pharynx: Uvula midline.  Eyes:     General:        Right eye: No discharge.        Left eye: No discharge.  Conjunctiva/sclera: Conjunctivae normal.     Right eye: Right conjunctiva is not injected. No chemosis.    Left eye: Left conjunctiva is not injected. No chemosis.    Pupils: Pupils are equal, round, and  reactive to light.  Cardiovascular:     Rate and Rhythm: Normal rate and regular rhythm.     Heart sounds: Normal heart sounds.  Pulmonary:     Effort: Pulmonary effort is normal. No tachypnea, accessory muscle usage or respiratory distress.     Breath sounds: Normal breath sounds. No wheezing, rhonchi or rales.  Chest:     Chest wall: No tenderness.  Abdominal:     Tenderness: There is no abdominal tenderness. There is no guarding or rebound.  Lymphadenopathy:     Head:     Right side of head: No submandibular, tonsillar or occipital adenopathy.     Left side of head: No submandibular, tonsillar or occipital adenopathy.     Cervical: No cervical adenopathy.  Skin:    General: Skin is warm.     Capillary Refill: Capillary refill takes less than 2 seconds.     Coloration: Skin is not pale.     Findings: No abrasion, erythema, petechiae or rash. Rash is not papular, urticarial or vesicular.  Neurological:     Mental Status: She is alert.  Psychiatric:        Behavior: Behavior is cooperative.      Diagnostic studies: labs sent instead          Salvatore Marvel, MD [facility]

## 2022-05-26 LAB — CBC WITH DIFFERENTIAL
Basophils Absolute: 0.1 10*3/uL (ref 0.0–0.2)
Basos: 1 %
EOS (ABSOLUTE): 0.1 10*3/uL (ref 0.0–0.4)
Eos: 1 %
Hematocrit: 35.6 % (ref 34.0–46.6)
Hemoglobin: 12.2 g/dL (ref 11.1–15.9)
Immature Grans (Abs): 0 10*3/uL (ref 0.0–0.1)
Immature Granulocytes: 0 %
Lymphocytes Absolute: 2 10*3/uL (ref 0.7–3.1)
Lymphs: 36 %
MCH: 31 pg (ref 26.6–33.0)
MCHC: 34.3 g/dL (ref 31.5–35.7)
MCV: 90 fL (ref 79–97)
Monocytes Absolute: 0.3 10*3/uL (ref 0.1–0.9)
Monocytes: 6 %
Neutrophils Absolute: 3.2 10*3/uL (ref 1.4–7.0)
Neutrophils: 56 %
RBC: 3.94 x10E6/uL (ref 3.77–5.28)
RDW: 12.6 % (ref 11.7–15.4)
WBC: 5.6 10*3/uL (ref 3.4–10.8)

## 2022-05-26 LAB — ALLERGENS W/COMP RFLX AREA 2
Alternaria Alternata IgE: <0.1 kU/L
Aspergillus Fumigatus IgE: <0.1 kU/L
Bermuda Grass IgE: <0.1 kU/L
Cedar, Mountain IgE: <0.1 kU/L
Cladosporium Herbarum IgE: <0.1 kU/L
Cockroach, German IgE: <0.1 kU/L
Common Silver Birch IgE: <0.1 kU/L
Cottonwood IgE: <0.1 kU/L
D Farinae IgE: <0.1 kU/L
D Pteronyssinus IgE: <0.1 kU/L
E001-IgE Cat Dander: 0.35 kU/L — AB
E005-IgE Dog Dander: 0.46 kU/L — AB
Elm, American IgE: <0.1 kU/L
Johnson Grass IgE: <0.1 kU/L
Maple/Box Elder IgE: <0.1 kU/L
Mouse Urine IgE: <0.1 kU/L
Oak, White IgE: <0.1 kU/L
Pecan, Hickory IgE: <0.1 kU/L
Penicillium Chrysogen IgE: <0.1 kU/L
Pigweed, Rough IgE: <0.1 kU/L
Ragweed, Short IgE: <0.1 kU/L
Sheep Sorrel IgE Qn: <0.1 kU/L
Timothy Grass IgE: <0.1 kU/L
White Mulberry IgE: <0.1 kU/L

## 2022-05-26 LAB — PANEL 606648
E101-IgE Can f 1: <0.1 kU/L
E102-IgE Can f 2: <0.1 kU/L
E221-IgE Can f 3: 0.17 kU/L — AB
E226-IgE Can f 5: <0.1 kU/L

## 2022-05-26 LAB — CMP14+EGFR
ALT: 13 IU/L (ref 0–32)
AST: 15 IU/L (ref 0–40)
Albumin/Globulin Ratio: 2 (ref 1.2–2.2)
Albumin: 4.5 g/dL (ref 3.8–4.8)
Alkaline Phosphatase: 83 IU/L (ref 44–121)
BUN/Creatinine Ratio: 13 (ref 12–28)
BUN: 11 mg/dL (ref 8–27)
Bilirubin Total: <0.2 mg/dL (ref 0.0–1.2)
CO2: 22 mmol/L (ref 20–29)
Calcium: 9.5 mg/dL (ref 8.7–10.3)
Chloride: 103 mmol/L (ref 96–106)
Creatinine, Ser: 0.84 mg/dL (ref 0.57–1.00)
Globulin, Total: 2.2 g/dL (ref 1.5–4.5)
Glucose: 89 mg/dL (ref 70–99)
Potassium: 4.3 mmol/L (ref 3.5–5.2)
Sodium: 140 mmol/L (ref 134–144)
Total Protein: 6.7 g/dL (ref 6.0–8.5)
eGFR: 72 mL/min/{1.73_m2} (ref >59)

## 2022-05-26 LAB — THYROID ANTIBODIES
Thyroglobulin Antibody: <1 IU/mL (ref 0.0–0.9)
Thyroperoxidase Ab SerPl-aCnc: <9 IU/mL (ref 0–34)

## 2022-05-26 LAB — TRYPTASE: Tryptase: 21.3 ug/L — ABNORMAL HIGH (ref 2.2–13.2)

## 2022-05-26 LAB — C3 AND C4
Complement C3, Serum: 124 mg/dL (ref 82–167)
Complement C4, Serum: 31 mg/dL (ref 12–38)

## 2022-05-26 LAB — COMPLEMENT COMPONENT C1Q: Complement C1Q: 11.4 mg/dL (ref 10.3–20.5)

## 2022-05-26 LAB — ALPHA-GAL PANEL
Allergen Lamb IgE: 8.42 kU/L — AB
Beef IgE: 9.28 kU/L — AB
IgE (Immunoglobulin E), Serum: 460 IU/mL (ref 6–495)
O215-IgE Alpha-Gal: >100 kU/L — AB
Pork IgE: 2.71 kU/L — AB

## 2022-05-26 LAB — PANEL 606578
E094-IgE Fel d 1: <0.1 kU/L
E220-IgE Fel d 2: <0.1 kU/L
E228-IgE Fel d 4: <0.1 kU/L

## 2022-05-26 LAB — ALLERGEN, TURMERIC, IGE
Class Interpretation: 0
Turmeric IgE*: <0.35 kU/L (ref <0.35)

## 2022-05-26 LAB — CHRONIC URTICARIA: cu index: <3.2 (ref <10)

## 2022-05-26 LAB — SEDIMENTATION RATE: Sed Rate: 8 mm/hr (ref 0–40)

## 2022-05-26 LAB — C1 ESTERASE INHIBITOR: C1INH SerPl-mCnc: 33 mg/dL (ref 21–39)

## 2022-05-26 LAB — C-REACTIVE PROTEIN: CRP: <1 mg/L (ref 0–10)

## 2022-05-26 LAB — ALLERGEN COMPONENT COMMENTS

## 2022-05-26 LAB — C1 ESTERASE INHIBITOR, FUNCTIONAL: C1INH Functional/C1INH Total MFr SerPl: >93 %mean normal

## 2022-05-26 LAB — ALLERGEN, MUSTARD, F89: F089-IgE Mustard: <0.1 kU/L

## 2022-05-26 LAB — ANTINUCLEAR ANTIBODIES, IFA: ANA Titer 1: NEGATIVE

## 2022-06-27 IMAGING — MG MM DIGITAL SCREENING BILAT W/ TOMO AND CAD
8 series · 9 of 24 positions shown · non-contrast
Comparison: Previous exam(s).

CLINICAL DATA: Screening.

EXAM:
DIGITAL SCREENING BILATERAL MAMMOGRAM WITH TOMOSYNTHESIS AND CAD
TECHNIQUE: Bilateral screening digital craniocaudal and mediolateral oblique
mammograms were obtained. Bilateral screening digital breast
tomosynthesis was performed. The images were evaluated with
computer-aided detection.

[L CC synth-2D]
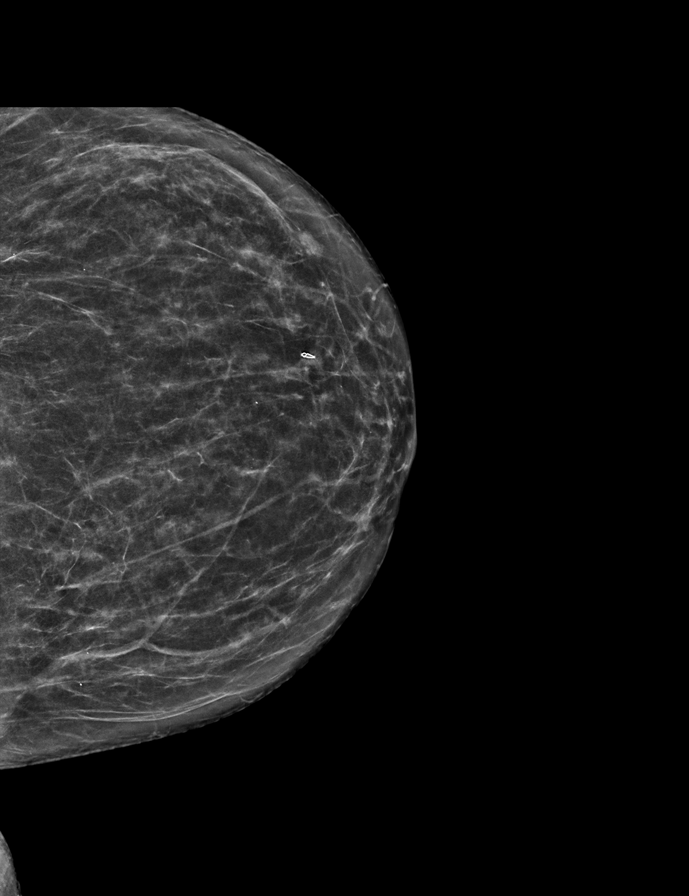

[L MLO synth-2D]
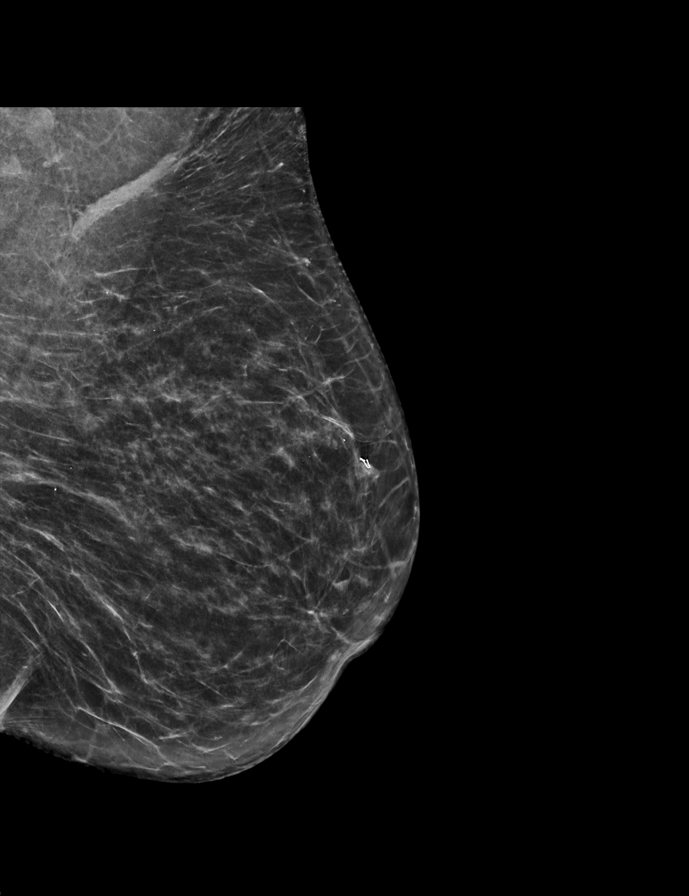

[R MLO synth-2D]
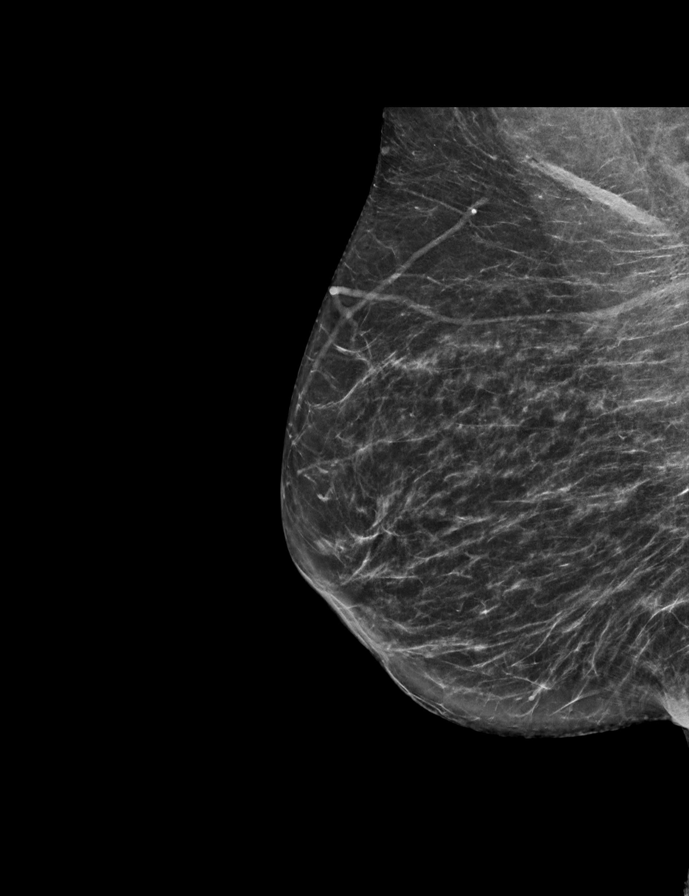

[R CC synth-2D]
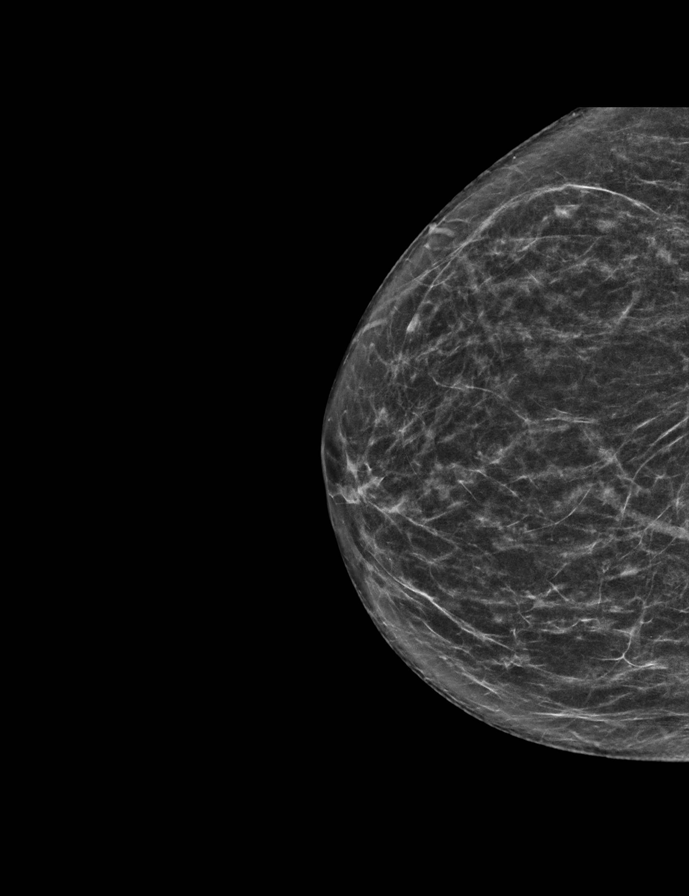

[L MLO tomo · 2 of 62 frames shown]
[frame 21/62]
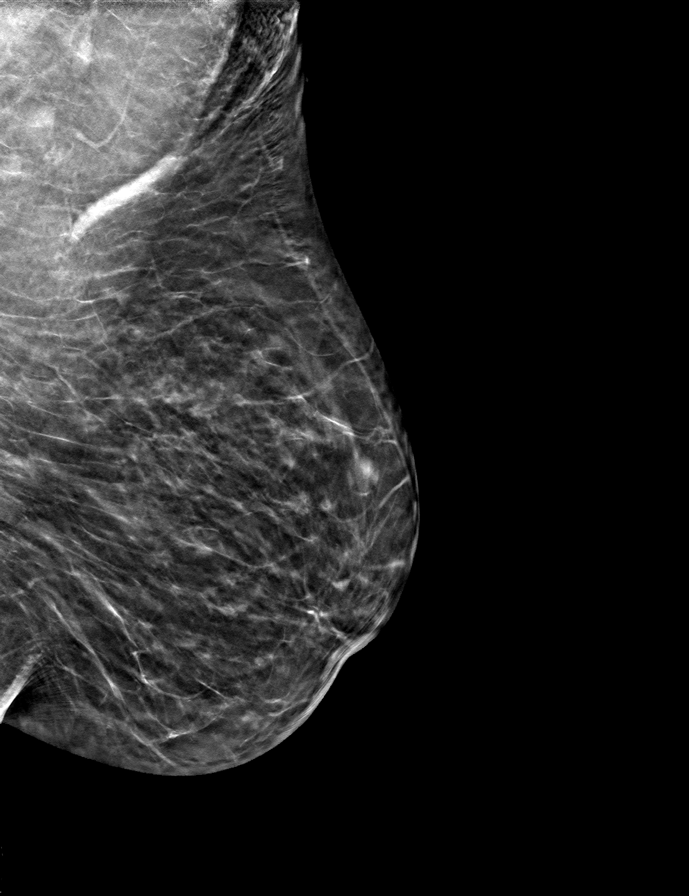
[frame 31/62]
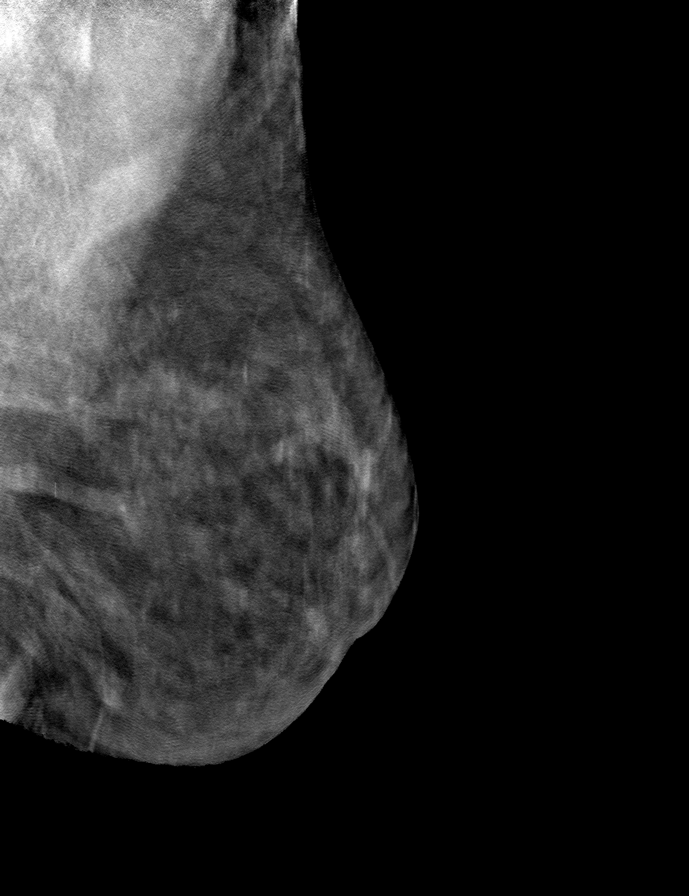

[L CC tomo · tomo slice 28/55.0]
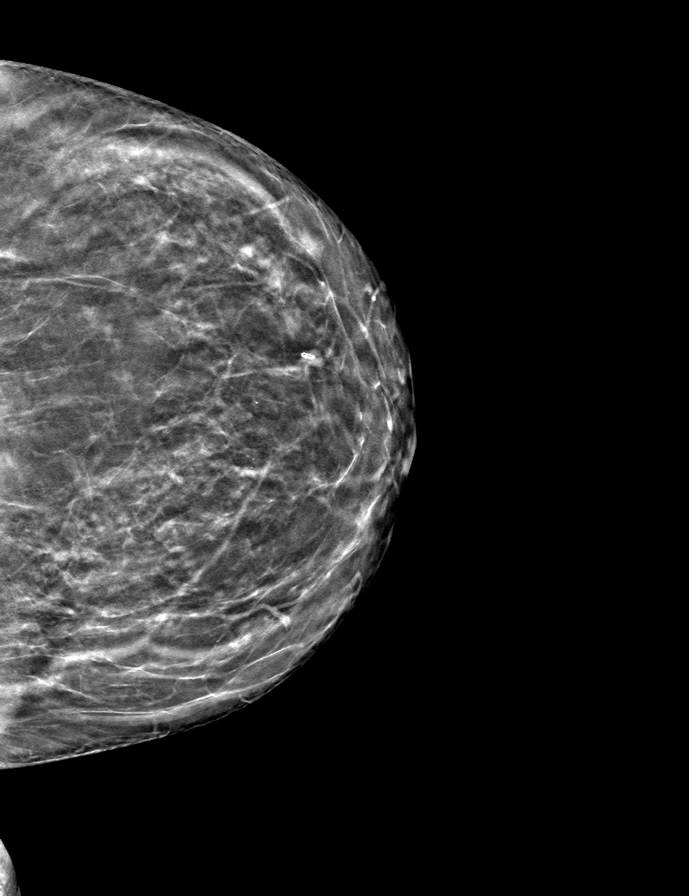

[R CC tomo · tomo slice 28/55.0]
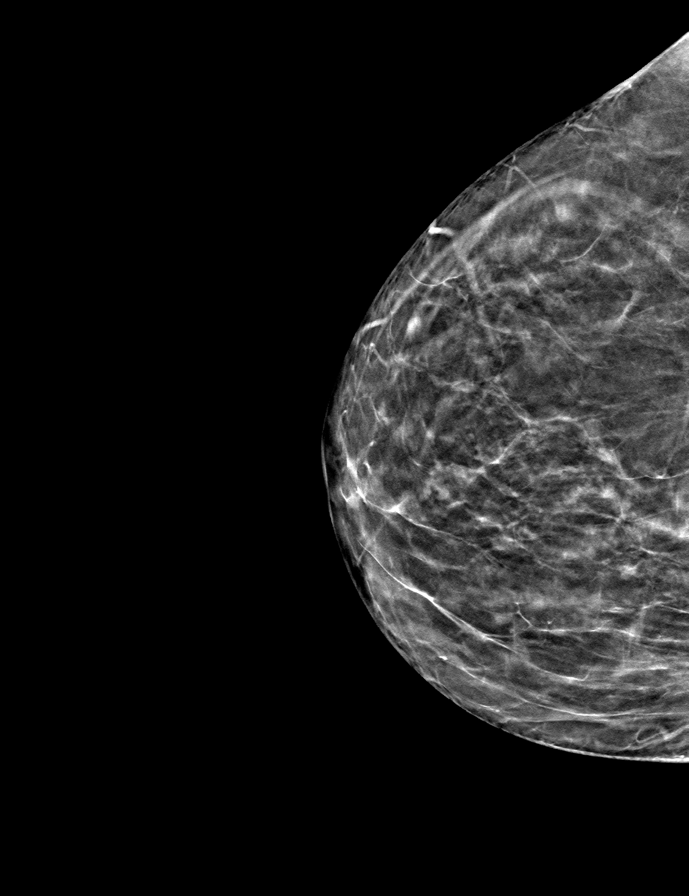

[R MLO tomo · tomo slice 31/60.0]
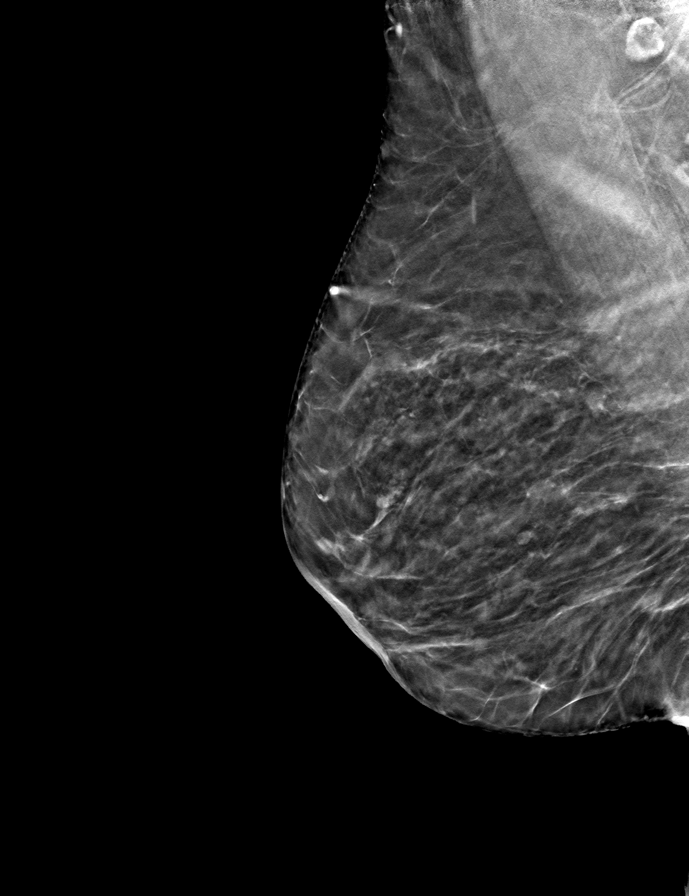

[9 of 24 positions shown; findings below may reference images not displayed]

ACR Breast Density Category c: The breast tissue is heterogeneously
dense, which may obscure small masses.
FINDINGS: There are no findings suspicious for malignancy.
IMPRESSION: No mammographic evidence of malignancy. A result letter of this
screening mammogram will be mailed directly to the patient.

RECOMMENDATION:
Screening mammogram in one year. (Code:Q3-W-BC3)

BI-RADS CATEGORY  1: Negative.

## 2022-07-01 ENCOUNTER — Encounter: Payer: Self-pay | Admitting: Family Medicine

## 2022-07-01 ENCOUNTER — Other Ambulatory Visit: Payer: Self-pay

## 2022-07-01 ENCOUNTER — Ambulatory Visit: Payer: Medicare PPO | Admitting: Family Medicine

## 2022-07-01 VITALS — BP 134/86 | HR 73 | Resp 16 | Ht 61.02 in

## 2022-07-01 DIAGNOSIS — T783XXD Angioneurotic edema, subsequent encounter: Secondary | ICD-10-CM | POA: Diagnosis not present

## 2022-07-01 DIAGNOSIS — L500 Allergic urticaria: Secondary | ICD-10-CM | POA: Diagnosis not present

## 2022-07-01 DIAGNOSIS — T783XXA Angioneurotic edema, initial encounter: Secondary | ICD-10-CM | POA: Insufficient documentation

## 2022-07-01 DIAGNOSIS — Z91018 Allergy to other foods: Secondary | ICD-10-CM | POA: Insufficient documentation

## 2022-07-01 DIAGNOSIS — J3089 Other allergic rhinitis: Secondary | ICD-10-CM | POA: Diagnosis not present

## 2022-07-01 NOTE — Progress Notes (Signed)
[place], [place] [address] Dept: [phone]  FOLLOW UP NOTE  Patient ID: Jackie Jackson, female    DOB: 1946/03/12  Age: 76 y.o. MRN: [id number] Date of Office Visit: 07/01/2022  Assessment  Chief Complaint: Angioedema (STILL HAVING ISSUES WITH LIP SWELLING - STARTED SUNDAY NIGHT. Was told she is allergic from red meat. Possible cross contamination. The steaks were cooked near her pork chop. Her top lip has a burning sensation and a crusty feel. )  HPI Jackie Jackson is a 76 year old female who presents to the clinic for follow-up visit.  She was last seen in this clinic on 05/18/2022 by Dr. Ernst Bowler as a new patient for evaluation of angioedema, allergic rhinitis, and dry mouth.  Review of her chart indicates that blood work at that time was positive for alpha gal as well as cat and dog allergies and HAE workup was normal. In the interim, she reports that she has experienced infrequent hives occurring intermittently and resolving with occasional use of Benadryl. However, on Sunday, she had a bacon sandwich for breakfast and then had a pork chop at dinner and about 4 hours after the pork chop ingestion she began to experience lip tingling and swelling. She denies cardiopulmonary or gastrointestinal symptoms at that time. At today's visit, she reports that she has been careful to avoid red meat, however, has occasionally eaten pork products. She also reports having milk with her cereal on a regular basis. EpiPens are up to date. Allergic rhinitis is reported as well controlled with no medical intervention al this time. She does have one dog and is not exposed to any cats. Her current medications are listed in the chart.    Drug Allergies:  Allergies  Allergen Reactions   Pantoprazole Sodium Anaphylaxis and Swelling    Protonix-tongue swelling   Statins     myalgia    Physical Exam: BP 134/86   Pulse 73   Resp 16   Ht 5' 1.02" (1.55 m)   LMP 08/04/1997   SpO2  96%   BMI 24.92 kg/m    Physical Exam Vitals reviewed.  Constitutional:      Appearance: Normal appearance.  HENT:     Head: Normocephalic and atraumatic.     Right Ear: Tympanic membrane normal.     Left Ear: Tympanic membrane normal.     Nose:     Comments: Bilateral nares slightly erythematous with clear nasal drainage noted.    Mouth/Throat:     Pharynx: Oropharynx is clear.  Eyes:     Conjunctiva/sclera: Conjunctivae normal.  Cardiovascular:     Rate and Rhythm: Normal rate and regular rhythm.     Heart sounds: Normal heart sounds. No murmur heard. Pulmonary:     Effort: Pulmonary effort is normal.     Breath sounds: Normal breath sounds.     Comments: Lung sounds clear to auscultation Musculoskeletal:        General: Normal range of motion.     Cervical back: Normal range of motion and neck supple.  Skin:    General: Skin is warm and dry.     Comments: Slight edema noted on top lip. No open areas or drainage noted.  Neurological:     Mental Status: She is alert and oriented to person, place, and time.  Psychiatric:        Mood and Affect: Mood normal.        Behavior: Behavior normal.  Thought Content: Thought content normal.        Judgment: Judgment normal.     Assessment and Plan: 1. Allergy to alpha-gal   2. Allergic urticaria   3. Perennial allergic rhinitis   4. Angioedema, subsequent encounter     Patient Instructions  Allergic rhinitis Continue allergen avoidance measures directed toward cat and dog Continue Claritin 10 mg once a day as needed for runny nose or itch Consider saline nasal rinses as needed for nasal symptoms. Use this before any medicated nasal sprays for best result  Alpha gal allergy Continue to avoid mammalian meat.  If you continue to breakout while avoiding mammalian meat you may need to avoid all mammalian products including milk and butter In [place] of an allergic reaction, take Benadryl 50 mg every 4 hours, and if  life-threatening symptoms occur, inject with EpiPen 0.3 mg.  Hives Continue Claritin 10 mg once a day as needed for hives or itching.  You may take an additional dose of Claritin 10 mg once a day as needed for breakthrough symptoms.  Patient cautioned that this may cause drowsiness If your symptoms re-occur, begin a journal of events that occurred for up to 6 hours before your symptoms began including foods and beverages consumed, soaps or perfumes you had contact with, and medications.  One lab order has been placed to recheck your tryptase. Get this lab in about 2-3 weeks. We will call you when the result becomes available  Angioedema Your lab work from your previous visit was negative for hereditary angioedema Keep track and take pictures of any further incidences of swelling Continue triamcinolone 0.1% paste up to twice a day as needed for red, itchy areas  Dry mouth Continue to hydrate and use the mouthwash from your dentist  Call the clinic if this treatment plan is not working well for you.  Follow up in 3 months or sooner if needed.   Return in about 3 months (around 10/01/2022), or if symptoms worsen or fail to improve.    Thank you for the opportunity to care for this patient.  Please do not hesitate to contact me with questions.  Gareth Morgan, FNP [facility] [place] [facility] [place]

## 2022-07-01 NOTE — Patient Instructions (Addendum)
Allergic rhinitis Continue allergen avoidance measures directed toward cat and dog Continue Claritin 10 mg once a day as needed for runny nose or itch Consider saline nasal rinses as needed for nasal symptoms. Use this before any medicated nasal sprays for best result  Alpha gal allergy Continue to avoid mammalian meat.  If you continue to breakout while avoiding mammalian meat you may need to avoid all mammalian products including milk and butter In [place] of an allergic reaction, take Benadryl 50 mg every 4 hours, and if life-threatening symptoms occur, inject with EpiPen 0.3 mg.  Hives Continue Claritin 10 mg once a day as needed for hives or itching.  You may take an additional dose of Claritin 10 mg once a day as needed for breakthrough symptoms.  Patient cautioned that this may cause drowsiness If your symptoms re-occur, begin a journal of events that occurred for up to 6 hours before your symptoms began including foods and beverages consumed, soaps or perfumes you had contact with, and medications.  One lab order has been placed to recheck your tryptase. Get this lab in about 2-3 weeks. We will call you when the result becomes available  Angioedema Your lab work from your previous visit was negative for hereditary angioedema Keep track and take pictures of any further incidences of swelling Continue triamcinolone 0.1% paste up to twice a day as needed for red, itchy areas  Dry mouth Continue to hydrate and use the mouthwash from your dentist  Call the clinic if this treatment plan is not working well for you.  Follow up in 3 months or sooner if needed.  Control of Dog or Cat Allergen Avoidance is the best way to manage a dog or cat allergy. If you have a dog or cat and are allergic to dog or cats, consider removing the dog or cat from the home. If you have a dog or cat but don't want to find it a new home, or if your family wants a pet even though someone in the household is  allergic, here are some strategies that may help keep symptoms at bay:  Keep the pet out of your bedroom and restrict it to only a few rooms. Be advised that keeping the dog or cat in only one room will not limit the allergens to that room. Don't pet, hug or kiss the dog or cat; if you do, wash your hands with soap and water. High-efficiency particulate air (HEPA) cleaners run continuously in a bedroom or living room can reduce allergen levels over time. Regular use of a high-efficiency vacuum cleaner or a central vacuum can reduce allergen levels. Giving your dog or cat a bath at least once a week can reduce airborne allergen.

## 2022-07-27 DIAGNOSIS — L500 Allergic urticaria: Secondary | ICD-10-CM | POA: Diagnosis not present

## 2022-07-29 LAB — TRYPTASE: Tryptase: 24.7 ug/L — ABNORMAL HIGH (ref 2.2–13.2)

## 2022-07-30 ENCOUNTER — Other Ambulatory Visit: Payer: Self-pay | Admitting: Family Medicine

## 2022-07-30 DIAGNOSIS — D8944 Hereditary alpha tryptasemia: Secondary | ICD-10-CM

## 2022-07-30 NOTE — Progress Notes (Signed)
Can you please let this patient know that her tryptase level remains elevated. Would recommend further testing including a 24 hour urine collection and a hereditary tryptasemia test. Unfortunately insurance generally will not pay for the hereditary tryptasemia testing which is about 170 dollars. I think the urine testing is covered. We can provide a code for urine testing if interested in contacting the insurance company before completing this test. Thank you

## 2022-09-02 ENCOUNTER — Ambulatory Visit (INDEPENDENT_AMBULATORY_CARE_PROVIDER_SITE_OTHER): Payer: Medicare PPO | Admitting: *Deleted

## 2022-09-02 DIAGNOSIS — Z0000 Encounter for general adult medical examination without abnormal findings: Secondary | ICD-10-CM | POA: Diagnosis not present

## 2022-09-02 NOTE — Progress Notes (Addendum)
Subjective:   Jackie Jackson is a 76 y.o. female who presents for Medicare Annual (Subsequent) preventive examination. I connected with  Manna Gose Broadwell on 09/18/22 by a audio enabled telemedicine application and verified that I am speaking with the correct person using two identifiers.  Patient Location: Home  Provider Location: Office/Clinic  I discussed the limitations of evaluation and management by telemedicine. The patient expressed understanding and agreed to proceed.  Review of Systems     Cardiac Risk Factors include: advanced age (>17mn, >>44women)     Objective:    There were no vitals filed for this visit. There is no height or weight on file to calculate BMI.     09/02/2022   11:15 AM 07/24/2021    1:46 PM 04/19/2018    3:08 PM 03/22/2017   11:22 AM 03/12/2017    9:07 AM  Advanced Directives  Does Patient Have a Medical Advance Directive? Yes No No Yes Yes  Type of Advance Directive Living will    [place] Does patient want to make changes to medical advance directive? No - Patient declined      Copy of [place] Chart?    No - copy requested No - copy requested  Would patient like information on creating a medical advance directive?  No - Patient declined Yes (MAU/Ambulatory/Procedural Areas - Information given);No - Patient declined      Current Medications (verified) Outpatient Encounter Medications as of 09/02/2022  Medication Sig   acetaminophen (TYLENOL) 500 MG tablet Take 500 mg by mouth 2 (two) times daily as needed for mild pain or headache.    Calcium Carb-Cholecalciferol (CALCIUM 1000 + D PO) Take 1 tablet by mouth daily.   EPINEPHrine (EPIPEN 2-PAK) 0.3 mg/0.3 mL IJ SOAJ injection Inject 0.3 mg into the muscle as needed for anaphylaxis.   ezetimibe (ZETIA) 10 MG tablet Take 1 tablet (10 mg total) by mouth daily.   fluticasone (FLONASE) 50 MCG/ACT nasal spray Place 2 sprays into both nostrils daily.    loratadine (CLARITIN) 10 MG tablet Take 1 tablet (10 mg total) by mouth daily. TAKE ONE (1) TABLET EACH DAY Strength: 10 mg   Multiple Vitamin (MULTIVITAMIN WITH MINERALS) TABS tablet Take 1 tablet by mouth daily.   Omega-3 Fatty Acids (FISH OIL) 1000 MG CAPS Take by mouth.   triamcinolone (KENALOG) 0.1 % paste Use as directed 1 Application in the mouth or throat 2 (two) times daily. (Patient not taking: Reported on 09/02/2022)   [DISCONTINUED] hydrocortisone 2.5 % cream Apply topically 2 (two) times daily. (Patient not taking: Reported on 07/01/2022)   No facility-administered encounter medications on file as of 09/02/2022.    Allergies (verified) Pantoprazole sodium, Alpha-gal, and Statins   History: Past Medical History:  Diagnosis Date   Acid reflux    Angio-edema    High cholesterol    Osteopenia    2006 -1.4, 2009 -1.9, 2011 -1.9   Past Surgical History:  Procedure Laterality Date   BREAST BIOPSY  2012   CATARACT EXTRACTION W/PHACO Left 03/22/2017   Procedure: CATARACT EXTRACTION PHACO AND INTRAOCULAR LENS PLACEMENT (IOC);  Surgeon: HTonny Branch MD;  Location: [facility];  Service: Ophthalmology;  Laterality: Left;  CDE:  8.65   CATARACT EXTRACTION W/PHACO Right 04/05/2017   Procedure: CATARACT EXTRACTION PHACO AND INTRAOCULAR LENS PLACEMENT (IOC);  Surgeon: HTonny Branch MD;  Location: [facility];  Service: Ophthalmology;  Laterality: Right;  CDE:  5.23  SKIN CANCER DESTRUCTION  2012   rt. 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   Tobacco Use   Smoking status: Never   Smokeless tobacco: Never  Vaping Use   Vaping Use: Never used  Substance and Sexual Activity   Alcohol use: No   Drug use: No   Sexual activity: Not Currently  Other Topics Concern   Not on file  Social History Narrative   Not on file   Social Determinants of Health   Financial Resource Strain: Low Risk  (09/02/2022)   Overall Financial Resource Strain (CARDIA)    Difficulty of Paying Living Expenses: Not hard at all  Food Insecurity: No Food Insecurity (09/02/2022)   Hunger Vital Sign    Worried About Running Out of Food in the Last Year: Never true    [place] in the Last Year: Never true  Transportation Needs: No Transportation Needs (09/02/2022)   PRAPARE - Hydrologist (Medical): No    Lack of Transportation (Non-Medical): No  Physical Activity: Sufficiently Active (09/02/2022)   Exercise Vital Sign    Days of Exercise per Week: 3 days    Minutes of Exercise per Session: 60 min  Stress: No Stress Concern Present (09/02/2022)   [place]    Feeling of Stress : Not at all  Social Connections: Not on file    Tobacco Counseling Counseling given: Not Answered   Clinical Intake:  Pre-visit preparation completed: Yes  Pain : No/denies pain     Nutritional Status: BMI of 19-24  Normal Nutritional Risks: None Diabetes: No     Diabetic?no         Activities of Daily Living    09/02/2022   11:16 AM  In your present state of health, do you have any difficulty performing the following activities:  Hearing? 0  Vision? 0  Difficulty concentrating or making decisions? 0  Walking or climbing stairs? 0  Dressing or bathing? 0  Doing errands, shopping? 0  Preparing Food and eating ? N  Using the Toilet? N  In the past six months, have you accidently leaked urine? N  Do you have  problems with loss of bowel control? N  Managing your Medications? N  Managing your Finances? N  Housekeeping or managing your Housekeeping? N    Patient Care Team: Gwenlyn Perking, FNP as PCP - General (Family Medicine) Huel Cote, NP (Inactive) as Nurse Practitioner (Obstetrics and Gynecology) Okey Regal, OD (Optometry)  Indicate any recent Medical Services you may have received from other than [facility] providers in the past year (date may be approximate).     Assessment:   This is a routine wellness examination for [facility].  Hearing/Vision screen No results found.  Dietary issues and exercise activities discussed: Current Exercise Habits: Structured exercise class, Type of exercise: calisthenics;strength training/weights;walking;stretching, Time (Minutes): 60, Frequency (Times/Week): 3, Weekly Exercise (Minutes/Week): 180, Exercise limited by: None identified   Goals Addressed             This Visit's Progress    Patient Stated       09/02/2022 AWV Goal: Fall Prevention  Over the next year, patient will decrease their risk for falls by: Using assistive devices, such as a cane or walker, as needed Identifying fall risks within their home and correcting them by: Removing throw rugs Adding handrails to stairs or ramps Removing clutter and keeping a clear pathway throughout the home Increasing light, especially at night Adding shower handles/bars Raising toilet seat Identifying potential personal risk factors for falls: Medication side effects Incontinence/urgency Vestibular dysfunction Hearing loss Musculoskeletal disorders Neurological disorders Orthostatic hypotension        Depression Screen    09/02/2022   11:16 AM 09/02/2022   11:13 AM 04/22/2022    9:34 AM 03/02/2022   11:20 AM 09/15/2021    8:22 AM 07/24/2021    1:52 PM 09/11/2020   11:01 AM  PHQ 2/9 Scores  PHQ - 2 Score 0 0 0 0 1 0 0  PHQ- 9 Score   0 3 1      Fall Risk    09/02/2022    11:15 AM 04/22/2022    9:33 AM 03/02/2022   11:20 AM 09/15/2021    8:16 AM 07/24/2021    1:52 PM  Fall Risk   Falls in the past year? 0 0 0 0 0  Number falls in past yr: 0      Injury with Fall? 0      Risk for fall due to : No Fall Risks      Follow up Falls evaluation completed    Falls evaluation completed    [place]:  Any stairs in or around the home? Yes  If so, are there any without handrails? Yes  Home free of loose throw rugs in walkways, pet beds, electrical cords, etc? Yes  Adequate lighting in your home to reduce risk of falls? Yes   ASSISTIVE DEVICES UTILIZED TO PREVENT FALLS:  Life alert? No  Use of a cane, walker or w/c? No  Grab bars in the bathroom? Yes  Shower chair or bench in shower? Yes  Elevated toilet seat or a handicapped toilet? No     Cognitive Function:    04/19/2018    3:10 PM  MMSE - Mini Mental State Exam  Orientation to time 5  Orientation to Place 5  Registration 3  Attention/ Calculation 5  Recall 1  Language- name 2 objects 2  Language- repeat 1  Language- follow 3 step command 3  Language- read & follow direction 1  Write a sentence 1  Copy design 1  Total score 28        09/02/2022   11:19 AM 07/24/2021    1:48 PM  6CIT Screen  What Year? 0 points 0 points  What month? 0 points 0 points  What time? 0 points 0 points  Count back from 20 0 points 2 points  Months in reverse 0 points 0 points  Repeat phrase 0 points 0 points  Total Score 0 points 2 points    Immunizations Immunization History  Administered Date(s) Administered   Fluad Quad(high Dose 65+) 08/11/2021, 07/29/2022   Influenza,  High Dose Seasonal PF 08/26/2016, 10/29/2017, 08/10/2018, 08/17/2019   Influenza-Unspecified 08/17/2019, 08/21/2020   Moderna Covid-19 Vaccine Bivalent Booster 24yr & up 08/12/2021   Moderna Sars-Covid-2 Vaccination 11/02/2019, 12/01/2019, 08/21/2020   Pneumococcal Conjugate-13 10/29/2017   Zoster  Recombinat (Shingrix) 11/17/2021, 02/05/2022    TDAP status: Due, Education has been provided regarding the importance of this vaccine. Advised may receive this vaccine at local pharmacy or Health Dept. Aware to provide a copy of the vaccination record if obtained from local pharmacy or Health Dept. Verbalized acceptance and understanding.  Flu Vaccine status: Up to date  Pneumococcal vaccine status: Up to date  Covid-19 vaccine status: Information provided on how to obtain vaccines.   Qualifies for Shingles Vaccine? No   Zostavax completed No   Shingrix Completed?: Yes  Screening Tests Health Maintenance  Topic Date Due   DTaP/Tdap/Td (1 - Tdap) Never done   COVID-19 Vaccine (5 - 2023-24 season) 05/29/2022   Pneumonia Vaccine 76 Years old (2 - PPSV23 or PCV20) 09/15/2022 (Originally 10/29/2018)   Hepatitis C Screening  09/15/2022 (Originally 03/06/1964)   Medicare Annual Wellness (AWV)  09/03/2023   INFLUENZA VACCINE  Completed   DEXA SCAN  Completed   Zoster Vaccines- Shingrix  Completed   HPV VACCINES  Aged Out    Health Maintenance  Health Maintenance Due  Topic Date Due   DTaP/Tdap/Td (1 - Tdap) Never done   COVID-19 Vaccine (5 - 2023-24 season) 05/29/2022    Colorectal cancer screening: No longer required.   Mammogram status: Completed 04/28/2022. Repeat every year  Bone Density status: Completed 09/24/2021. Results reflect: Bone density results: OSTEOPENIA. Repeat every 2 years.  Lung Cancer Screening: (Low Dose CT Chest recommended if Age 76-80years, 30 pack-year currently smoking OR have quit w/in 15years.) does qualify.   Lung Cancer Screening Referral: N/A  Additional Screening:  Hepatitis C Screening: does qualify; Completed N/A  Vision Screening: Recommended annual ophthalmology exams for early detection of glaucoma and other disorders of the eye. Is the patient up to date with their annual eye exam?  Yes  Who is the provider or what is the name of the  office in which the patient attends annual eye exams? PCP If pt is not established with a provider, would they like to be referred to a provider to establish care? No .   Dental Screening: Recommended annual dental exams for proper oral hygiene  Community Resource Referral / Chronic Care Management: CRR required this visit?  No   CCM required this visit?  No      Plan:     I have personally reviewed and noted the following in the patient's chart:   Medical and social history Use of alcohol, tobacco or illicit drugs  Current medications and supplements including opioid prescriptions. Patient is not currently taking opioid prescriptions. Functional ability and status Nutritional status Physical activity Advanced directives List of other physicians Hospitalizations, surgeries, and ER visits in previous 12 months Vitals Screenings to include cognitive, depression, and falls Referrals and appointments  In addition, I have reviewed and discussed with patient certain preventive protocols, quality metrics, and best practice recommendations. A written personalized care plan for preventive services as well as general preventive health recommendations were provided to patient.     WCindy Hazy [place]  09/02/2022   Nurse Notes:

## 2022-09-02 NOTE — Progress Notes (Deleted)
Subjective:   Jackie Jackson is a 76 y.o. female who presents for Medicare Annual (Subsequent) preventive examination.  Review of Systems     Cardiac Risk Factors include: advanced age (>42mn, >>49women)     Objective:    There were no vitals filed for this visit. There is no height or weight on file to calculate BMI.     09/02/2022   11:15 AM 07/24/2021    1:46 PM 04/19/2018    3:08 PM 03/22/2017   11:22 AM 03/12/2017    9:07 AM  Advanced Directives  Does Patient Have a Medical Advance Directive? Yes No No Yes Yes  Type of Advance Directive Living will    [place] Does patient want to make changes to medical advance directive? No - Patient declined      Copy of [place] Chart?    No - copy requested No - copy requested  Would patient like information on creating a medical advance directive?  No - Patient declined Yes (MAU/Ambulatory/Procedural Areas - Information given);No - Patient declined      Current Medications (verified) Outpatient Encounter Medications as of 09/02/2022  Medication Sig   acetaminophen (TYLENOL) 500 MG tablet Take 500 mg by mouth 2 (two) times daily as needed for mild pain or headache.    Calcium Carb-Cholecalciferol (CALCIUM 1000 + D PO) Take 1 tablet by mouth daily.   EPINEPHrine (EPIPEN 2-PAK) 0.3 mg/0.3 mL IJ SOAJ injection Inject 0.3 mg into the muscle as needed for anaphylaxis.   ezetimibe (ZETIA) 10 MG tablet Take 1 tablet (10 mg total) by mouth daily.   fluticasone (FLONASE) 50 MCG/ACT nasal spray Place 2 sprays into both nostrils daily.   loratadine (CLARITIN) 10 MG tablet Take 1 tablet (10 mg total) by mouth daily. TAKE ONE (1) TABLET EACH DAY Strength: 10 mg   Multiple Vitamin (MULTIVITAMIN WITH MINERALS) TABS tablet Take 1 tablet by mouth daily.   Omega-3 Fatty Acids (FISH OIL) 1000 MG CAPS Take by mouth.   triamcinolone (KENALOG) 0.1 % paste Use as directed 1 Application in the mouth or throat 2  (two) times daily. (Patient not taking: Reported on 09/02/2022)   [DISCONTINUED] hydrocortisone 2.5 % cream Apply topically 2 (two) times daily. (Patient not taking: Reported on 07/01/2022)   No facility-administered encounter medications on file as of 09/02/2022.    Allergies (verified) Pantoprazole sodium, Alpha-gal, and Statins   History: Past Medical History:  Diagnosis Date   Acid reflux    Angio-edema    High cholesterol    Osteopenia    2006 -1.4, 2009 -1.9, 2011 -1.9   Past Surgical History:  Procedure Laterality Date   BREAST BIOPSY  2012   CATARACT EXTRACTION W/PHACO Left 03/22/2017   Procedure: CATARACT EXTRACTION PHACO AND INTRAOCULAR LENS PLACEMENT (IOC);  Surgeon: HTonny Branch MD;  Location: [facility];  Service: Ophthalmology;  Laterality: Left;  CDE:  8.65   CATARACT EXTRACTION W/PHACO Right 04/05/2017   Procedure: CATARACT EXTRACTION PHACO AND INTRAOCULAR LENS PLACEMENT (IOC);  Surgeon: HTonny Branch MD;  Location: [facility];  Service: Ophthalmology;  Laterality: Right;  CDE:  5.23    SKIN CANCER DESTRUCTION  2012   rt. 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   Tobacco Use   Smoking status: Never   Smokeless tobacco: Never  Vaping Use   Vaping Use: Never used  Substance and Sexual Activity   Alcohol use: No   Drug use: No   Sexual activity: Not Currently  Other Topics Concern   Not on file  Social History Narrative   Not on file   Social Determinants of Health    Financial Resource Strain: Low Risk  (09/02/2022)   Overall Financial Resource Strain (CARDIA)    Difficulty of Paying Living Expenses: Not hard at all  Food Insecurity: No Food Insecurity (09/02/2022)   Hunger Vital Sign    Worried About Running Out of Food in the Last Year: Never true    [place] in the Last Year: Never true  Transportation Needs: No Transportation Needs (09/02/2022)   PRAPARE - Hydrologist (Medical): No    Lack of Transportation (Non-Medical): No  Physical Activity: Sufficiently Active (09/02/2022)   Exercise Vital Sign    Days of Exercise per Week: 3 days    Minutes of Exercise per Session: 60 min  Stress: No Stress Concern Present (09/02/2022)   [place]    Feeling of Stress : Not at all  Social Connections: Not on file    Tobacco Counseling Counseling given: Not Answered   Clinical Intake:  Pre-visit preparation completed: Yes  Pain : No/denies pain     Nutritional Status: BMI of 19-24  Normal Nutritional Risks: None Diabetes: No     Diabetic?NO       Activities of Daily Living    09/02/2022   11:16 AM  In your present state of health, do you have any difficulty performing the following activities:  Hearing? 0  Vision? 0  Difficulty concentrating or making decisions? 0  Walking or climbing stairs? 0  Dressing or bathing? 0  Doing errands, shopping? 0  Preparing Food and eating ? N  Using the Toilet? N  In the past six months, have you accidently leaked urine? N  Do you have problems with loss of bowel control? N  Managing your Medications? N  Managing your Finances? N  Housekeeping or managing your Housekeeping? N    Patient Care Team: Gwenlyn Perking, FNP as PCP - General (Family Medicine) Huel Cote, NP (Inactive) as Nurse Practitioner (Obstetrics and Gynecology) Okey Regal, OD (Optometry)  Indicate any recent  Medical Services you may have received from other than [facility] providers in the past year (date may be approximate).     Assessment:   This is a routine wellness examination for [facility].  Hearing/Vision screen No results found.  Dietary issues and exercise activities discussed: Current Exercise Habits: Structured exercise class, Type of exercise: calisthenics;strength training/weights;walking;stretching, Time (Minutes): 60, Frequency (Times/Week): 3, Weekly Exercise (Minutes/Week): 180, Exercise limited by: None identified   Goals Addressed   None   Depression Screen    09/02/2022   11:16 AM 09/02/2022   11:13  AM 04/22/2022    9:34 AM 03/02/2022   11:20 AM 09/15/2021    8:22 AM 07/24/2021    1:52 PM 09/11/2020   11:01 AM  PHQ 2/9 Scores  PHQ - 2 Score 0 0 0 0 1 0 0  PHQ- 9 Score   0 3 1      Fall Risk    09/02/2022   11:15 AM 04/22/2022    9:33 AM 03/02/2022   11:20 AM 09/15/2021    8:16 AM 07/24/2021    1:52 PM  Fall Risk   Falls in the past year? 0 0 0 0 0  Number falls in past yr: 0      Injury with Fall? 0      Risk for fall due to : No Fall Risks      Follow up Falls evaluation completed    Falls evaluation completed    [place]:  Any stairs in or around the home? Yes  If so, are there any without handrails? Yes  Home free of loose throw rugs in walkways, pet beds, electrical cords, etc? Yes  Adequate lighting in your home to reduce risk of falls? Yes   ASSISTIVE DEVICES UTILIZED TO PREVENT FALLS:  Life alert? No  Use of a cane, walker or w/c? No  Grab bars in the bathroom? Yes  Shower chair or bench in shower? Yes  Elevated toilet seat or a handicapped toilet? No    Cognitive Function:    04/19/2018    3:10 PM  MMSE - Mini Mental State Exam  Orientation to time 5  Orientation to Place 5  Registration 3  Attention/ Calculation 5  Recall 1  Language- name 2 objects 2  Language- repeat 1  Language- follow 3 step  command 3  Language- read & follow direction 1  Write a sentence 1  Copy design 1  Total score 28        09/02/2022   11:19 AM 07/24/2021    1:48 PM  6CIT Screen  What Year? 0 points 0 points  What month? 0 points 0 points  What time? 0 points 0 points  Count back from 20 0 points 2 points  Months in reverse 0 points 0 points  Repeat phrase 0 points 0 points  Total Score 0 points 2 points    Immunizations Immunization History  Administered Date(s) Administered   Fluad Quad(high Dose 65+) 08/11/2021, 07/29/2022   Influenza, High Dose Seasonal PF 08/26/2016, 10/29/2017, 08/10/2018, 08/17/2019   Influenza-Unspecified 08/17/2019, 08/21/2020   Moderna Covid-19 Vaccine Bivalent Booster 37yr & up 08/12/2021   Moderna Sars-Covid-2 Vaccination 11/02/2019, 12/01/2019, 08/21/2020   Pneumococcal Conjugate-13 10/29/2017   Zoster Recombinat (Shingrix) 11/17/2021, 02/05/2022    TDAP status: Due, Education has been provided regarding the importance of this vaccine. Advised may receive this vaccine at local pharmacy or Health Dept. Aware to provide a copy of the vaccination record if obtained from local pharmacy or Health Dept. Verbalized acceptance and understanding.  Flu Vaccine status: Up to date  Pneumococcal vaccine status: Up to date  Covid-19 vaccine status: Information provided on how to obtain vaccines.   Qualifies for Shingles Vaccine? No   Zostavax completed No   Shingrix Completed?: Yes  Screening Tests Health Maintenance  Topic Date Due   DTaP/Tdap/Td (1 - Tdap) Never done   COVID-19 Vaccine (5 - 2023-24 season) 05/29/2022   Pneumonia Vaccine 76 Years old (2 - PPSV23 or PCV20) 09/15/2022 (Originally  10/29/2018)   Hepatitis C Screening  09/15/2022 (Originally 03/06/1964)   Medicare Annual Wellness (AWV)  09/03/2023   INFLUENZA VACCINE  Completed   DEXA SCAN  Completed   Zoster Vaccines- Shingrix  Completed   HPV VACCINES  Aged Out    Health Maintenance  Health  Maintenance Due  Topic Date Due   DTaP/Tdap/Td (1 - Tdap) Never done   COVID-19 Vaccine (5 - 2023-24 season) 05/29/2022    Colorectal cancer screening: Type of screening: {Colorectal Screening Types:24994}. Completed ***. Repeat every *** years  {Mammogram status:21018020}  Bone Density status: Completed  . Results reflect: Bone density results: OSTEOPENIA. Repeat every 2 years.  Lung Cancer Screening: (Low Dose CT Chest recommended if Age 65-80 years, 30 pack-year currently smoking OR have quit w/in 15years.) does not qualify.   Lung Cancer Screening Referral: N/A  Additional Screening:  Hepatitis C Screening: does not qualify; Completed N/A  Vision Screening: Recommended annual ophthalmology exams for early detection of glaucoma and other disorders of the eye. Is the patient up to date with their annual eye exam?  Yes  Who is the provider or what is the name of the office in which the patient attends annual eye exams? *** If pt is not established with a provider, would they like to be referred to a provider to establish care? {YES/NO:21197}.   Dental Screening: Recommended annual dental exams for proper oral hygiene  Community Resource Referral / Chronic Care Management: CRR required this visit?  No   CCM required this visit?  No      Plan:     I have personally reviewed and noted the following in the patient's chart:   Medical and social history Use of alcohol, tobacco or illicit drugs  Current medications and supplements including opioid prescriptions. Patient is not currently taking opioid prescriptions. Functional ability and status Nutritional status Physical activity Advanced directives List of other physicians Hospitalizations, surgeries, and ER visits in previous 12 months Vitals Screenings to include cognitive, depression, and falls Referrals and appointments  In addition, I have reviewed and discussed with patient certain preventive protocols, quality  metrics, and best practice recommendations. A written personalized care plan for preventive services as well as general preventive health recommendations were provided to patient.     Cindy Hazy, [place]   09/02/2022   Nurse Notes: ***        Subjective:   Jackie Jackson is a 76 y.o. female who presents for Medicare Annual (Subsequent) preventive examination.  Review of Systems     Cardiac Risk Factors include: advanced age (>60mn, >>18women)     Objective:    There were no vitals filed for this visit. There is no height or weight on file to calculate BMI.     09/02/2022   11:15 AM 07/24/2021    1:46 PM 04/19/2018    3:08 PM 03/22/2017   11:22 AM 03/12/2017    9:07 AM  Advanced Directives  Does Patient Have a Medical Advance Directive? Yes No No Yes Yes  Type of Advance Directive Living will    [place] Does patient want to make changes to medical advance directive? No - Patient declined      Copy of [place] Chart?    No - copy requested No - copy requested  Would patient like information on creating a medical advance directive?  No - Patient declined Yes (MAU/Ambulatory/Procedural Areas - Information given);No - Patient declined  Current Medications (verified) Outpatient Encounter Medications as of 09/02/2022  Medication Sig   acetaminophen (TYLENOL) 500 MG tablet Take 500 mg by mouth 2 (two) times daily as needed for mild pain or headache.    Calcium Carb-Cholecalciferol (CALCIUM 1000 + D PO) Take 1 tablet by mouth daily.   EPINEPHrine (EPIPEN 2-PAK) 0.3 mg/0.3 mL IJ SOAJ injection Inject 0.3 mg into the muscle as needed for anaphylaxis.   ezetimibe (ZETIA) 10 MG tablet Take 1 tablet (10 mg total) by mouth daily.   fluticasone (FLONASE) 50 MCG/ACT nasal spray Place 2 sprays into both nostrils daily.   loratadine (CLARITIN) 10 MG tablet Take 1 tablet (10 mg total) by mouth daily. TAKE ONE (1) TABLET EACH DAY  Strength: 10 mg   Multiple Vitamin (MULTIVITAMIN WITH MINERALS) TABS tablet Take 1 tablet by mouth daily.   Omega-3 Fatty Acids (FISH OIL) 1000 MG CAPS Take by mouth.   triamcinolone (KENALOG) 0.1 % paste Use as directed 1 Application in the mouth or throat 2 (two) times daily. (Patient not taking: Reported on 09/02/2022)   [DISCONTINUED] hydrocortisone 2.5 % cream Apply topically 2 (two) times daily. (Patient not taking: Reported on 07/01/2022)   No facility-administered encounter medications on file as of 09/02/2022.    Allergies (verified) Pantoprazole sodium, Alpha-gal, and Statins   History: Past Medical History:  Diagnosis Date   Acid reflux    Angio-edema    High cholesterol    Osteopenia    2006 -1.4, 2009 -1.9, 2011 -1.9   Past Surgical History:  Procedure Laterality Date   BREAST BIOPSY  2012   CATARACT EXTRACTION W/PHACO Left 03/22/2017   Procedure: CATARACT EXTRACTION PHACO AND INTRAOCULAR LENS PLACEMENT (IOC);  Surgeon: Tonny Branch, MD;  Location: [facility];  Service: Ophthalmology;  Laterality: Left;  CDE:  8.65   CATARACT EXTRACTION W/PHACO Right 04/05/2017   Procedure: CATARACT EXTRACTION PHACO AND INTRAOCULAR LENS PLACEMENT (IOC);  Surgeon: Tonny Branch, MD;  Location: [facility];  Service: Ophthalmology;  Laterality: Right;  CDE:  5.23    SKIN CANCER DESTRUCTION  2012   rt. 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   Tobacco Use   Smoking status: Never   Smokeless tobacco: Never   Vaping Use   Vaping Use: Never used  Substance and Sexual Activity   Alcohol use: No   Drug use: No   Sexual activity: Not Currently  Other Topics Concern   Not on file  Social History Narrative   Not on file   Social Determinants of Health   Financial Resource Strain: Low Risk  (09/02/2022)   Overall Financial Resource Strain (CARDIA)    Difficulty of Paying Living Expenses: Not hard at all  Food Insecurity: No Food Insecurity (09/02/2022)   Hunger Vital Sign    Worried About Running Out  of Food in the Last Year: Never true    [place] in the Last Year: Never true  Transportation Needs: No Transportation Needs (09/02/2022)   PRAPARE - Hydrologist (Medical): No    Lack of Transportation (Non-Medical): No  Physical Activity: Sufficiently Active (09/02/2022)   Exercise Vital Sign    Days of Exercise per Week: 3 days    Minutes of Exercise per Session: 60 min  Stress: No Stress Concern Present (09/02/2022)   [place]    Feeling of Stress : Not at all  Social Connections: Not on file    Tobacco Counseling Counseling given: Not Answered   Clinical Intake:  Pre-visit preparation completed: Yes  Pain : No/denies pain     Nutritional Status: BMI of 19-24  Normal Nutritional Risks: None Diabetes: No     Diabetic?NO         Activities of Daily Living    09/02/2022   11:16 AM  In your present state of health, do you have any difficulty performing the following activities:  Hearing? 0  Vision? 0  Difficulty concentrating or making decisions? 0  Walking or climbing stairs? 0  Dressing or bathing? 0  Doing errands, shopping? 0  Preparing Food and eating ? N  Using the Toilet? N  In the past six months, have you accidently leaked urine? N  Do you have problems with loss of bowel control? N  Managing your Medications? N  Managing your Finances? N   Housekeeping or managing your Housekeeping? N    Patient Care Team: Gwenlyn Perking, FNP as PCP - General (Family Medicine) Huel Cote, NP (Inactive) as Nurse Practitioner (Obstetrics and Gynecology) Okey Regal, OD (Optometry)  Indicate any recent Medical Services you may have received from other than [facility] providers in the past year (date may be approximate).     Assessment:   This is a routine wellness examination for [facility].  Hearing/Vision screen No results found.  Dietary issues and exercise activities discussed: Current Exercise Habits: Structured exercise class, Type of exercise: calisthenics;strength training/weights;walking;stretching, Time (Minutes): 60, Frequency (Times/Week): 3, Weekly Exercise (Minutes/Week): 180, Exercise limited by: None identified   Goals Addressed   None   Depression Screen    09/02/2022   11:16 AM 09/02/2022   11:13 AM 04/22/2022    9:34 AM 03/02/2022   11:20 AM 09/15/2021    8:22 AM 07/24/2021    1:52 PM 09/11/2020   11:01 AM  PHQ 2/9 Scores  PHQ - 2 Score 0 0 0 0 1 0 0  PHQ- 9 Score   0 3 1      Fall Risk    09/02/2022   11:15 AM 04/22/2022    9:33 AM 03/02/2022   11:20 AM 09/15/2021    8:16 AM 07/24/2021    1:52 PM  Fall Risk   Falls in the past year? 0 0 0 0 0  Number falls in past yr: 0      Injury with Fall? 0      Risk for fall due to : No Fall Risks      Follow up Falls evaluation completed    Falls evaluation completed    [place]:  Any stairs in or around the home? Yes  If so, are there any without handrails? Yes  Home free of loose throw rugs in walkways, pet beds, electrical  cords, etc? Yes  Adequate lighting in your home to reduce risk of falls? Yes   ASSISTIVE DEVICES UTILIZED TO PREVENT FALLS:  Life alert? No  Use of a cane, walker or w/c? No  Grab bars in the bathroom? Yes  Shower chair or bench in shower? Yes  Elevated toilet seat or a handicapped toilet? No      Cognitive Function:    04/19/2018    3:10 PM  MMSE - Mini Mental State Exam  Orientation to time 5  Orientation to Place 5  Registration 3  Attention/ Calculation 5  Recall 1  Language- name 2 objects 2  Language- repeat 1  Language- follow 3 step command 3  Language- read & follow direction 1  Write a sentence 1  Copy design 1  Total score 28        09/02/2022   11:19 AM 07/24/2021    1:48 PM  6CIT Screen  What Year? 0 points 0 points  What month? 0 points 0 points  What time? 0 points 0 points  Count back from 20 0 points 2 points  Months in reverse 0 points 0 points  Repeat phrase 0 points 0 points  Total Score 0 points 2 points    Immunizations Immunization History  Administered Date(s) Administered   Fluad Quad(high Dose 65+) 08/11/2021, 07/29/2022   Influenza, High Dose Seasonal PF 08/26/2016, 10/29/2017, 08/10/2018, 08/17/2019   Influenza-Unspecified 08/17/2019, 08/21/2020   Moderna Covid-19 Vaccine Bivalent Booster 51yr & up 08/12/2021   Moderna Sars-Covid-2 Vaccination 11/02/2019, 12/01/2019, 08/21/2020   Pneumococcal Conjugate-13 10/29/2017   Zoster Recombinat (Shingrix) 11/17/2021, 02/05/2022    TDAP status: Due, Education has been provided regarding the importance of this vaccine. Advised may receive this vaccine at local pharmacy or Health Dept. Aware to provide a copy of the vaccination record if obtained from local pharmacy or Health Dept. Verbalized acceptance and understanding.  Flu Vaccine status: Up to date  Pneumococcal vaccine status: Up to date  Covid-19 vaccine status: Information provided on how to obtain vaccines.   Qualifies for Shingles Vaccine? No   Zostavax completed No   Shingrix Completed?: Yes  Screening Tests Health Maintenance  Topic Date Due   DTaP/Tdap/Td (1 - Tdap) Never done   COVID-19 Vaccine (5 - 2023-24 season) 05/29/2022   Pneumonia Vaccine 76 Years old (2 - PPSV23 or PCV20) 09/15/2022 (Originally  10/29/2018)   Hepatitis C Screening  09/15/2022 (Originally 03/06/1964)   Medicare Annual Wellness (AWV)  09/03/2023   INFLUENZA VACCINE  Completed   DEXA SCAN  Completed   Zoster Vaccines- Shingrix  Completed   HPV VACCINES  Aged Out    Health Maintenance  Health Maintenance Due  Topic Date Due   DTaP/Tdap/Td (1 - Tdap) Never done   COVID-19 Vaccine (5 - 2023-24 season) 05/29/2022    Colorectal cancer screening: No longer required.   Mammogram status: Completed 04/28/2022. Repeat every year  Bone Density status: Completed 09/24/2021. Results reflect: Bone density results: OSTEOPENIA. Repeat every 2 years.  Lung Cancer Screening: (Low Dose CT Chest recommended if Age 614-80years, 30 pack-year currently smoking OR have quit w/in 15years.) does not qualify.   Lung Cancer Screening Referral: N/A  Additional Screening:  Hepatitis C Screening: does qualify; Completed N/A  Vision Screening: Recommended annual ophthalmology exams for early detection of glaucoma and other disorders of the eye. Is the patient up to date with their annual eye exam?  Yes  Who is the provider or what is the  name of the office in which the patient attends annual eye exams? [facility] If pt is not established with a provider, would they like to be referred to a provider to establish care? No .   Dental Screening: Recommended annual dental exams for proper oral hygiene  Community Resource Referral / Chronic Care Management: CRR required this visit?  No   CCM required this visit?  No      Plan:     I have personally reviewed and noted the following in the patient's chart:   Medical and social history Use of alcohol, tobacco or illicit drugs  Current medications and supplements including opioid prescriptions. Patient is not currently taking opioid prescriptions. Functional ability and status Nutritional status Physical activity Advanced directives List of other physicians Hospitalizations,  surgeries, and ER visits in previous 12 months Vitals Screenings to include cognitive, depression, and falls Referrals and appointments  In addition, I have reviewed and discussed with patient certain preventive protocols, quality metrics, and best practice recommendations. A written personalized care plan for preventive services as well as general preventive health recommendations were provided to patient.     Cindy Hazy, [place]   09/02/2022   Nurse Notes: N/A

## 2022-11-24 NOTE — Patient Instructions (Incomplete)
Allergic rhinitis Continue allergen avoidance measures directed toward cat and dog Continue Claritin 10 mg once a day as needed for runny nose or itch Consider saline nasal rinses as needed for nasal symptoms. Use this before any medicated nasal sprays for best result  Alpha gal allergy Continue to avoid mammalian meat.  If you continue to breakout while avoiding mammalian meat you may need to avoid all mammalian products including milk and butter In Boy of an allergic reaction, take Benadryl 50 mg every 4 hours, and if life-threatening symptoms occur, inject with EpiPen 0.3 mg. An order has been placed to help [place] evaluate your alpha gal allergy. We will call you when the results become available  Hives Continue Claritin 10 mg once a day as needed for hives or itching.  You may take an additional dose of Claritin 10 mg once a day as needed for breakthrough symptoms.  Patient cautioned that this may cause drowsiness If your symptoms re-occur, begin a journal of events that occurred for up to 6 hours before your symptoms began including foods and beverages consumed, soaps or perfumes you had contact with, and medications.  One lab order has been placed to recheck your tryptase. We will call you when the result becomes available  Angioedema Your lab work from your previous visit was negative for hereditary angioedema Keep track and take pictures of any further incidences of swelling Continue triamcinolone 0.1% paste up to twice a day as needed for red, itchy areas  Dry mouth Continue to hydrate and use the mouthwash from your dentist  Call the clinic if this treatment plan is not working well for you.  Follow up in 6 months or sooner if needed.  Control of Dog or Cat Allergen Avoidance is the best way to manage a dog or cat allergy. If you have a dog or cat and are allergic to dog or cats, consider removing the dog or cat from the home. If you have a dog or cat but don't want to find it a  new home, or if your family wants a pet even though someone in the household is allergic, here are some strategies that may help keep symptoms at bay:  Keep the pet out of your bedroom and restrict it to only a few rooms. Be advised that keeping the dog or cat in only one room will not limit the allergens to that room. Don't pet, hug or kiss the dog or cat; if you do, wash your hands with soap and water. High-efficiency particulate air (HEPA) cleaners run continuously in a bedroom or living room can reduce allergen levels over time. Regular use of a high-efficiency vacuum cleaner or a central vacuum can reduce allergen levels. Giving your dog or cat a bath at least once a week can reduce airborne allergen.

## 2022-11-24 NOTE — Progress Notes (Unsigned)
   [place], [place] [address] Dept: [phone]  FOLLOW UP NOTE  Patient ID: Jackie Jackson, female    DOB: 1945/12/04  Age: 77 y.o. MRN: [id number] Date of Office Visit: 11/25/2022  Assessment  Chief Complaint: No chief complaint on file.  HPI Jackie Jackson is a 77 year old female who presents to the clinic for follow-up visit.  She was last seen in this clinic on 07/01/2022 by Gareth Morgan, FNP, for evaluation of alpha gal, allergic rhinitis, urticaria, angioedema, and dry mouth.  At that time, her tryptase level remained elevated on the lab recheck.   Drug Allergies:  Allergies  Allergen Reactions   Pantoprazole Sodium Anaphylaxis and Swelling    Protonix-tongue swelling   Alpha-Gal    Statins     myalgia    Physical Exam: LMP 08/04/1997    Physical Exam  Diagnostics:    Assessment and Plan: No diagnosis found.  No orders of the defined types were placed in this encounter.   There are no Patient Instructions on file for this visit.  No follow-ups on file.    Thank you for the opportunity to care for this patient.  Please do not hesitate to contact me with questions.  Gareth Morgan, FNP [facility]

## 2022-11-25 ENCOUNTER — Other Ambulatory Visit: Payer: Self-pay

## 2022-11-25 ENCOUNTER — Ambulatory Visit: Payer: Medicare PPO | Admitting: Family Medicine

## 2022-11-25 ENCOUNTER — Encounter: Payer: Self-pay | Admitting: Family Medicine

## 2022-11-25 VITALS — BP 160/70 | HR 108 | Temp 98.5°F | Resp 18 | Ht 62.0 in | Wt 137.2 lb

## 2022-11-25 DIAGNOSIS — J3089 Other allergic rhinitis: Secondary | ICD-10-CM | POA: Diagnosis not present

## 2022-11-25 DIAGNOSIS — Z91038 Other insect allergy status: Secondary | ICD-10-CM

## 2022-11-25 DIAGNOSIS — R748 Abnormal levels of other serum enzymes: Secondary | ICD-10-CM

## 2022-11-25 DIAGNOSIS — W57XXXD Bitten or stung by nonvenomous insect and other nonvenomous arthropods, subsequent encounter: Secondary | ICD-10-CM

## 2022-11-25 DIAGNOSIS — L500 Allergic urticaria: Secondary | ICD-10-CM | POA: Diagnosis not present

## 2022-11-25 DIAGNOSIS — Z91018 Allergy to other foods: Secondary | ICD-10-CM | POA: Diagnosis not present

## 2022-11-26 ENCOUNTER — Encounter: Payer: Self-pay | Admitting: Family Medicine

## 2022-11-26 DIAGNOSIS — W57XXXA Bitten or stung by nonvenomous insect and other nonvenomous arthropods, initial encounter: Secondary | ICD-10-CM | POA: Insufficient documentation

## 2022-11-26 DIAGNOSIS — R748 Abnormal levels of other serum enzymes: Secondary | ICD-10-CM | POA: Insufficient documentation

## 2022-11-26 MED ORDER — TRIAMCINOLONE ACETONIDE 0.1 % MT PSTE: PASTE | OROMUCOSAL | 5 refills | Status: DC

## 2022-11-28 LAB — ALPHA-GAL PANEL
Allergen Lamb IgE: 6.05 kU/L — AB
Beef IgE: 4.67 kU/L — AB
IgE (Immunoglobulin E), Serum: 282 IU/mL (ref 6–495)
O215-IgE Alpha-Gal: 56.1 kU/L — AB
Pork IgE: 2.53 kU/L — AB

## 2022-11-28 LAB — TRYPTASE: Tryptase: 20.6 ug/L — ABNORMAL HIGH (ref 2.2–13.2)

## 2022-11-30 NOTE — Addendum Note (Signed)
Addended by: Dara Hoyer on: 11/30/2022 02:46 PM   Modules accepted: Orders

## 2022-11-30 NOTE — Progress Notes (Signed)
Can you please let this patient know that her alpha gal numbers are still elevated and she should continue to avoid mammalian meats for now. Please also let her know that they are trending down and we can recheck in about 6 months. Please also let her know that her tryptase number remains elevated. I am placing a urine test to help [place] evaluate the high tryptase. Thank you.

## 2022-12-17 ENCOUNTER — Telehealth: Payer: Self-pay | Admitting: Family Medicine

## 2022-12-17 NOTE — Telephone Encounter (Signed)
This will be ordered at her appt tomorrow.

## 2022-12-17 NOTE — Telephone Encounter (Signed)
Patient said that she needs her PCP to send her a cologuard in the mail like she has done in the past.

## 2022-12-18 ENCOUNTER — Encounter: Payer: Self-pay | Admitting: Family Medicine

## 2022-12-18 ENCOUNTER — Ambulatory Visit: Payer: Medicare PPO | Admitting: Family Medicine

## 2022-12-18 VITALS — BP 125/69 | HR 74 | Temp 98.1°F | Ht 62.0 in | Wt 133.1 lb

## 2022-12-18 DIAGNOSIS — E663 Overweight: Secondary | ICD-10-CM | POA: Diagnosis not present

## 2022-12-18 DIAGNOSIS — E7800 Pure hypercholesterolemia, unspecified: Secondary | ICD-10-CM

## 2022-12-18 DIAGNOSIS — J301 Allergic rhinitis due to pollen: Secondary | ICD-10-CM

## 2022-12-18 DIAGNOSIS — Z6824 Body mass index (BMI) 24.0-24.9, adult: Secondary | ICD-10-CM | POA: Diagnosis not present

## 2022-12-18 DIAGNOSIS — K219 Gastro-esophageal reflux disease without esophagitis: Secondary | ICD-10-CM | POA: Diagnosis not present

## 2022-12-18 DIAGNOSIS — Z789 Other specified health status: Secondary | ICD-10-CM

## 2022-12-18 DIAGNOSIS — Z1211 Encounter for screening for malignant neoplasm of colon: Secondary | ICD-10-CM | POA: Diagnosis not present

## 2022-12-18 NOTE — Progress Notes (Signed)
Established Patient Office Visit  Subjective   Patient ID: Jackie Jackson, female    DOB: 04/20/1946  Age: 77 y.o. MRN: [id number]  Chief Complaint  Patient presents with   Medical Management of Chronic Issues   Gastroesophageal Reflux   Hyperlipidemia    Gastroesophageal Reflux  Hyperlipidemia   Jackie Jackson is here for a chronic follow up.  HLD Last LDL was 177. She was started on zetia and has not had a repeat since starting.  She has started exercising 4x a week at the gym. She is doing a silver sneakers class. She plans to start walking when the weather improves.   She has stopped eating red meats due to alpha gal allergy. She eats a lot of vegetables and poultry now.   2. GERD Current medications - tums OTC prn Sore throat - No Voice change - No Hemoptysis - No Dysphagia or dyspepsia - No Water brash - No Red Flags (weight loss, hematochezia, melena, weight loss, early satiety, fevers, odynophagia, or persistent vomiting) - No  3. Allergies She is now established with allergy. She continues to take clartin daily, She uses flonase and nasal saline prn. She reprots good control currently. Denies hives or angioedema.      ROS As per HPI.    Objective:     BP 125/69 Comment: at home reading per pt  Pulse 74   Temp 98.1 F (36.7 C) (Temporal)   Ht 5\' 2"  (1.575 m)   Wt 133 lb 2 oz (60.4 kg)   LMP 08/04/1997   SpO2 95%   BMI 24.35 kg/m    Physical Exam Vitals and nursing note reviewed.  Constitutional:      General: She is not in acute distress.    Appearance: She is not ill-appearing, toxic-appearing or diaphoretic.  HENT:     Head: Normocephalic and atraumatic.     Right Ear: Tympanic membrane, ear canal and external ear normal.     Left Ear: Tympanic membrane, ear canal and external ear normal.     Nose: Nose normal.     Mouth/Throat:     Mouth: Mucous membranes are moist.     Pharynx: Oropharynx is clear.  Eyes:     General:        Right eye: No  discharge.        Left eye: No discharge.  Neck:     Vascular: No carotid bruit.  Cardiovascular:     Rate and Rhythm: Normal rate and regular rhythm.     Heart sounds: Normal heart sounds. No murmur heard. Pulmonary:     Effort: Pulmonary effort is normal. No respiratory distress.     Breath sounds: Normal breath sounds.  Abdominal:     General: Bowel sounds are normal.     Palpations: Abdomen is soft.  Musculoskeletal:     Cervical back: Neck supple. No rigidity or tenderness.     Right lower leg: No edema.     Left lower leg: No edema.  Skin:    General: Skin is warm and dry.  Neurological:     General: No focal deficit present.     Mental Status: She is alert and oriented to person, place, and time.  Psychiatric:        Mood and Affect: Mood normal.        Behavior: Behavior normal.        Thought Content: Thought content normal.      No results found for any  visits on 12/18/22.    The 10-year ASCVD risk score (Arnett DK, et al., 2019) is: 17.5%    Assessment & Plan:   Jackie Jackson was seen today for medical management of chronic issues, gastroesophageal reflux and hyperlipidemia.  Diagnoses and all orders for this visit:  Pure hypercholesterolemia Statin intolerance Fasting lipid panel pending since starting zetia. Diet and exercise.  -     Lipid panel -     CMP14+EGFR  Chronic seasonal allergic rhinitis due to pollen Now established with allergy. Continue claritin, flonase, nasal spray.   Overweight (BMI 25.0-29.9) Diet and exercise.   Screening for colon cancer -     Cologuard  Gastroesophageal reflux disease without esophagitis Well controlled with tums OTC prn.    Return in about 6 months (around 06/20/2023) for CPE.   The patient indicates understanding of these issues and agrees with the plan.  Gwenlyn Perking, FNP

## 2022-12-19 LAB — CMP14+EGFR
ALT: 18 IU/L (ref 0–32)
AST: 19 IU/L (ref 0–40)
Albumin/Globulin Ratio: 2.4 — ABNORMAL HIGH (ref 1.2–2.2)
Albumin: 4.7 g/dL (ref 3.8–4.8)
Alkaline Phosphatase: 84 IU/L (ref 44–121)
BUN/Creatinine Ratio: 14 (ref 12–28)
BUN: 10 mg/dL (ref 8–27)
Bilirubin Total: 0.3 mg/dL (ref 0.0–1.2)
CO2: 24 mmol/L (ref 20–29)
Calcium: 9.8 mg/dL (ref 8.7–10.3)
Chloride: 103 mmol/L (ref 96–106)
Creatinine, Ser: 0.74 mg/dL (ref 0.57–1.00)
Globulin, Total: 2 g/dL (ref 1.5–4.5)
Glucose: 92 mg/dL (ref 70–99)
Potassium: 4.7 mmol/L (ref 3.5–5.2)
Sodium: 140 mmol/L (ref 134–144)
Total Protein: 6.7 g/dL (ref 6.0–8.5)
eGFR: 84 mL/min/{1.73_m2} (ref >59)

## 2022-12-19 LAB — LIPID PANEL
Chol/HDL Ratio: 3.3 ratio (ref 0.0–4.4)
Cholesterol, Total: 236 mg/dL — ABNORMAL HIGH (ref 100–199)
HDL: 72 mg/dL (ref >39)
LDL Chol Calc (NIH): 151 mg/dL — ABNORMAL HIGH (ref 0–99)
Triglycerides: 78 mg/dL (ref 0–149)
VLDL Cholesterol Cal: 13 mg/dL (ref 5–40)

## 2023-01-06 DIAGNOSIS — Z1211 Encounter for screening for malignant neoplasm of colon: Secondary | ICD-10-CM | POA: Diagnosis not present

## 2023-01-13 LAB — COLOGUARD: COLOGUARD: POSITIVE — AB

## 2023-01-14 ENCOUNTER — Telehealth: Payer: Self-pay | Admitting: *Deleted

## 2023-01-14 DIAGNOSIS — R195 Other fecal abnormalities: Secondary | ICD-10-CM

## 2023-01-14 NOTE — Telephone Encounter (Signed)
Pt called back wanting referral for GI to go to [facility]-referral placed per pt request.

## 2023-01-19 ENCOUNTER — Encounter: Payer: Self-pay | Admitting: Physician Assistant

## 2023-02-25 ENCOUNTER — Encounter: Payer: Self-pay | Admitting: *Deleted

## 2023-03-16 ENCOUNTER — Ambulatory Visit: Payer: Medicare PPO | Admitting: Nurse Practitioner

## 2023-03-16 ENCOUNTER — Encounter: Payer: Self-pay | Admitting: Nurse Practitioner

## 2023-03-16 VITALS — BP 142/74 | HR 94 | Temp 98.4°F | Resp 20 | Ht 62.0 in | Wt 133.0 lb

## 2023-03-16 DIAGNOSIS — R350 Frequency of micturition: Secondary | ICD-10-CM | POA: Diagnosis not present

## 2023-03-16 DIAGNOSIS — N3000 Acute cystitis without hematuria: Secondary | ICD-10-CM

## 2023-03-16 LAB — URINALYSIS, COMPLETE
Bilirubin, UA: NEGATIVE
Glucose, UA: NEGATIVE
Ketones, UA: NEGATIVE
Nitrite, UA: NEGATIVE
Protein,UA: NEGATIVE
Specific Gravity, UA: 1.01 (ref 1.005–1.030)
Urobilinogen, Ur: 0.2 mg/dL (ref 0.2–1.0)
pH, UA: 6 (ref 5.0–7.5)

## 2023-03-16 LAB — MICROSCOPIC EXAMINATION
Epithelial Cells (non renal): NONE SEEN /hpf (ref 0–10)
Renal Epithel, UA: NONE SEEN /hpf
WBC, UA: >30 /hpf — AB (ref 0–5)

## 2023-03-16 MED ORDER — DOXYCYCLINE HYCLATE 100 MG PO TABS: 100.0000 mg | ORAL_TABLET | Freq: Two times a day (BID) | ORAL | 0 refills | Status: DC

## 2023-03-16 MED ORDER — CEPHALEXIN 500 MG PO CAPS: 500.0000 mg | ORAL_CAPSULE | Freq: Two times a day (BID) | ORAL | 0 refills | Status: DC

## 2023-03-16 NOTE — Progress Notes (Signed)
Subjective:    Patient ID: Jackie Jackson, female    DOB: 15-Aug-1946, 77 y.o.   MRN: [id number]   Chief Complaint: Urinary Frequency   Urinary Frequency  This is a new problem. The current episode started in the past 7 days. The problem occurs every urination. The problem has been unchanged. The patient is experiencing no pain. There has been no fever. There is No history of pyelonephritis. Associated symptoms include frequency. Pertinent negatives include no chills, discharge, flank pain, hematuria, hesitancy, nausea, urgency or vomiting. Treatments tried: otc med similar to AZO (she cannot remember exactly what it is) The treatment provided mild relief.    Patient Active Problem List   Diagnosis Date Noted   Elevated serum tryptase 11/26/2022   Bug bite 11/26/2022   Allergic urticaria 07/01/2022   Perennial allergic rhinitis 07/01/2022   Allergy to alpha-gal 07/01/2022   Angio-edema 07/01/2022   Overweight (BMI 25.0-29.9) 09/15/2021   Statin intolerance 12/04/2018   Abnormal rhythmic movement of tongue 11/25/2018   Chronic seasonal allergic rhinitis due to pollen 11/05/2017   Cancer (HCC)    Osteopenia    Gastroesophageal reflux disease without esophagitis    Pure hypercholesterolemia        Review of Systems  Constitutional:  Negative for chills, fatigue and fever.  Respiratory:  Negative for shortness of breath.   Cardiovascular:  Negative for chest pain.  Gastrointestinal:  Negative for nausea and vomiting.  Genitourinary:  Positive for frequency. Negative for difficulty urinating, dysuria, flank pain, hematuria, hesitancy and urgency.  Musculoskeletal:  Negative for back pain.  All other systems reviewed and are negative.      Objective:   Physical Exam Vitals and nursing note reviewed.  Constitutional:      General: She is not in acute distress.    Appearance: Normal appearance. She is not ill-appearing.  Cardiovascular:     Rate and Rhythm: Normal rate and  regular rhythm.     Pulses: Normal pulses.     Heart sounds: Normal heart sounds.  Pulmonary:     Effort: Pulmonary effort is normal.     Breath sounds: Normal breath sounds.  Abdominal:     General: Abdomen is flat. Bowel sounds are normal. There is no distension.     Palpations: Abdomen is soft.     Tenderness: There is no abdominal tenderness. There is no right CVA tenderness, left CVA tenderness or guarding.  Skin:    General: Skin is warm and dry.     Capillary Refill: Capillary refill takes less than 2 seconds.  Neurological:     General: No focal deficit present.     Mental Status: She is alert and oriented to person, place, and time.  Psychiatric:        Mood and Affect: Mood normal.        Behavior: Behavior normal.      BP (!) 142/74   Pulse 94   Temp 98.4 F (36.9 C) (Temporal)   Resp 20   Ht 5\' 2"  (1.575 m)   Wt 133 lb (60.3 kg)   LMP 08/04/1997   SpO2 96%   BMI 24.33 kg/m       Assessment & Plan:  Jackie Jackson in today with chief complaint of Urinary Frequency   1. Frequent urination - Urinalysis, Complete - Urine Culture 2. Acute cystitis without hematuria Increase water intake Avoid caffeine and soft drinks Avoid baths or hot tubs Wear cotton underwear Return PRN - doxycycline (  VIBRA-TABS) 100 MG tablet; Take 1 tablet (100 mg total) by mouth 2 (two) times daily. 1 po bid  Dispense: 20 tablet; Refill: 0    The above assessment and management plan was discussed with the patient. The patient verbalized understanding of and has agreed to the management plan. Patient is aware to call the clinic if symptoms persist or worsen. Patient is aware when to return to the clinic for a follow-up visit. Patient educated on when it is appropriate to go to the emergency department.   Consuello Bossier, FNP student  Bennie Pierini, FNP

## 2023-03-18 LAB — URINE CULTURE

## 2023-03-22 ENCOUNTER — Ambulatory Visit: Payer: Medicare PPO | Admitting: Physician Assistant

## 2023-03-22 ENCOUNTER — Encounter: Payer: Self-pay | Admitting: Physician Assistant

## 2023-03-22 VITALS — BP 140/60 | HR 80 | Ht 61.75 in | Wt 133.5 lb

## 2023-03-22 DIAGNOSIS — K5900 Constipation, unspecified: Secondary | ICD-10-CM

## 2023-03-22 DIAGNOSIS — R195 Other fecal abnormalities: Secondary | ICD-10-CM | POA: Diagnosis not present

## 2023-03-22 MED ORDER — NA SULFATE-K SULFATE-MG SULF 17.5-3.13-1.6 GM/177ML PO SOLN: 1.0000 | Freq: Once | ORAL | 0 refills | Status: AC

## 2023-03-22 NOTE — Progress Notes (Signed)
Chief Complaint: Positive Cologuard  HPI:    Jackie Jackson is a 77 year old female with a past medical history as listed below, who was referred to me by Gabriel Earing, FNP for a complaint of positive Cologuard.      01/06/2023 Cologuard positive.    Today, the patient presents to clinic today with her daughter and tells me that she has never had a colonoscopy.  She has always been followed with Cologuard's which have been negative in the past.  She was told she needed follow-up with [place] now in regards to a positive one.  Does tell me she has chronic constipation and sometimes will have a good bowel movement and other times will go days to just little balls.  Tells me she is nervous to have a colonoscopy given that one of her good friends had a perforation and she is nervous that she will "bleed to death".    Denies fever, chills, weight loss, blood in her stool, nausea, vomiting, abdominal pain or symptoms that awaken her from sleep.  Past Medical History:  Diagnosis Date   Acid reflux    Angio-edema    High cholesterol    Osteopenia    2006 -1.4, 2009 -1.9, 2011 -1.9    Past Surgical History:  Procedure Laterality Date   BREAST BIOPSY Left 2012   CATARACT EXTRACTION W/PHACO Left 03/22/2017   Procedure: CATARACT EXTRACTION PHACO AND INTRAOCULAR LENS PLACEMENT (IOC);  Surgeon: Gemma Payor, MD;  Location: [facility];  Service: Ophthalmology;  Laterality: Left;  CDE:  8.65   CATARACT EXTRACTION W/PHACO Right 04/05/2017   Procedure: CATARACT EXTRACTION PHACO AND INTRAOCULAR LENS PLACEMENT (IOC);  Surgeon: Gemma Payor, MD;  Location: [facility];  Service: Ophthalmology;  Laterality: Right;  CDE:  5.23    SKIN CANCER DESTRUCTION  2012   rt. lower leg   TUBAL LIGATION      Current Outpatient Medications  Medication Sig Dispense Refill   acetaminophen (TYLENOL) 500 MG tablet Take 500 mg by mouth 2 (two) times daily as needed for mild pain or headache.      Calcium Carb-Cholecalciferol (CALCIUM  1000 + D PO) Take 1 tablet by mouth daily.     doxycycline (VIBRA-TABS) 100 MG tablet Take 1 tablet (100 mg total) by mouth 2 (two) times daily. 1 po bid 20 tablet 0   ezetimibe (ZETIA) 10 MG tablet Take 1 tablet (10 mg total) by mouth daily. 90 tablet 3   fluticasone (FLONASE) 50 MCG/ACT nasal spray Place 2 sprays into both nostrils daily. 16 g 6   loratadine (CLARITIN) 10 MG tablet Take 1 tablet (10 mg total) by mouth daily. TAKE ONE (1) TABLET EACH DAY Strength: 10 mg 90 tablet 3   Multiple Vitamin (MULTIVITAMIN WITH MINERALS) TABS tablet Take 1 tablet by mouth daily.     Omega-3 Fatty Acids (FISH OIL) 1000 MG CAPS Take by mouth.     EPINEPHrine (EPIPEN 2-PAK) 0.3 mg/0.3 mL IJ SOAJ injection Inject 0.3 mg into the muscle as needed for anaphylaxis. (Patient not taking: Reported on 03/22/2023) 1 each 2   No current facility-administered medications for this visit.    Allergies as of 03/22/2023 - Review Complete 03/22/2023  Allergen Reaction Noted   Pantoprazole sodium Anaphylaxis and Swelling    Alpha-gal  09/02/2022   Amoxicillin Other (See Comments) 03/16/2023   Statins  12/04/2018    Family History  Problem Relation Age of Onset   Heart disease Mother    Heart disease  Father    Lung cancer Sister    Diabetes Brother    Heart disease Maternal Grandmother    Healthy Daughter    Healthy Son     Social History   Socioeconomic History   Marital status: Married    Spouse name: Darcel Bayley   Number of children: 2   Years of education: Not on file   Highest education level: Not on file  Occupational History   Occupation: retired     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   Tobacco Use   Smoking status: Never   Smokeless tobacco: Never  Vaping Use   Vaping Use: Never used  Substance and Sexual Activity   Alcohol use: No   Drug use: No   Sexual activity: Not Currently  Other Topics Concern   Not on file  Social History Narrative   Not on file   Social Determinants of  Health   Financial Resource Strain: Low Risk  (09/02/2022)   Overall Financial Resource Strain (CARDIA)    Difficulty of Paying Living Expenses: Not hard at all  Food Insecurity: No Food Insecurity (09/02/2022)   Hunger Vital Sign    Worried About Running Out of Food in the Last Year: Never true    Ran Out of Food in the Last Year: Never true  Transportation Needs: No Transportation Needs (09/02/2022)   PRAPARE - Administrator, Civil Service (Medical): No    Lack of Transportation (Non-Medical): No  Physical Activity: Sufficiently Active (09/02/2022)   Exercise Vital Sign    Days of Exercise per Week: 3 days    Minutes of Exercise per Session: 60 min  Stress: No Stress Concern Present (09/02/2022)   [employer] of Occupational Health - Occupational Stress Questionnaire    Feeling of Stress : Not at all  Social Connections: Not on file  Intimate Partner Violence: Not At Risk (09/02/2022)   Humiliation, Afraid, Rape, and Kick questionnaire    Fear of Current or Ex-Partner: No    Emotionally Abused: No    Physically Abused: No    Sexually Abused: No    Review of Systems:    Constitutional: No weight loss, fever or chills Skin: No rash  Cardiovascular: No chest pain Respiratory: No SOB  Gastrointestinal: See HPI and otherwise negative Genitourinary: No dysuria  Neurological: No headache, dizziness or syncope Musculoskeletal: No new muscle or joint pain Hematologic: No bleeding  Psychiatric: No history of depression or anxiety   Physical Exam:  Vital signs: BP (!) 160/60 (BP Location: Left Arm, Patient Position: Sitting, Cuff Size: Normal)   Pulse 80   Ht 5' 1.75" (1.568 m) Comment: height measured without shoes  Wt 133 lb 8 oz (60.6 kg)   LMP 08/04/1997   BMI 24.62 kg/m    Constitutional:   Pleasant Elderly Caucasian female appears to be in NAD, Well developed, Well nourished, alert and cooperative Head:  Normocephalic and atraumatic. Eyes:   PEERL, EOMI.  No icterus. Conjunctiva pink. Ears:  Normal auditory acuity. Neck:  Supple Throat: Oral cavity and pharynx without inflammation, swelling or lesion.  Respiratory: Respirations even and unlabored. Lungs clear to auscultation bilaterally.   No wheezes, crackles, or rhonchi.  Cardiovascular: Normal S1, S2. No MRG. Regular rate and rhythm. No peripheral edema, cyanosis or pallor.  Gastrointestinal:  Soft, nondistended, nontender. No rebound or guarding. Normal bowel sounds. No appreciable masses or hepatomegaly. Rectal:  Not performed.  Msk:  Symmetrical without gross deformities. Without  edema, no deformity or joint abnormality.  Neurologic:  Alert and  oriented x4;  grossly normal neurologically.  Skin:   Dry and intact without significant lesions or rashes. Psychiatric: Demonstrates good judgement and reason without abnormal affect or behaviors.  RELEVANT LABS AND IMAGING: CBC    Component Value Date/Time   WBC 5.6 05/18/2022 1437   WBC 4.4 03/12/2017 0908   RBC 3.94 05/18/2022 1437   RBC 4.28 03/12/2017 0908   HGB 12.2 05/18/2022 1437   HCT 35.6 05/18/2022 1437   PLT 266 04/22/2022 1009   MCV 90 05/18/2022 1437   MCH 31.0 05/18/2022 1437   MCH 29.7 03/12/2017 0908   MCHC 34.3 05/18/2022 1437   MCHC 32.5 03/12/2017 0908   RDW 12.6 05/18/2022 1437   LYMPHSABS 2.0 05/18/2022 1437   MONOABS 0.3 03/12/2017 0908   EOSABS 0.1 05/18/2022 1437   BASOSABS 0.1 05/18/2022 1437    CMP     Component Value Date/Time   NA 140 12/18/2022 1050   K 4.7 12/18/2022 1050   CL 103 12/18/2022 1050   CO2 24 12/18/2022 1050   GLUCOSE 92 12/18/2022 1050   GLUCOSE 80 03/12/2017 0908   BUN 10 12/18/2022 1050   CREATININE 0.74 12/18/2022 1050   CALCIUM 9.8 12/18/2022 1050   PROT 6.7 12/18/2022 1050   ALBUMIN 4.7 12/18/2022 1050   AST 19 12/18/2022 1050   ALT 18 12/18/2022 1050   ALKPHOS 84 12/18/2022 1050   BILITOT 0.3 12/18/2022 1050   GFRNONAA 55 (L) 09/11/2020 1156   GFRAA 63 09/11/2020  1156    Assessment: 1.  Positive Cologuard: In April of this year 2.  Constipation: Chronic for the patient, likely related to diet and decreased fluid intake  Plan: 1.  Scheduled patient for diagnostic colonoscopy in the [facility].  She requested a female physician and was given Dr. Leonides Schanz.  Did provide the patient a detailed list of risks for the procedure and she agrees to proceed.  We discussed these in detail and I answered all of her questions. Patient is appropriate for endoscopic procedure(s) in the ambulatory ([facility]) setting.  2.  Patient will have a 2-day bowel prep given history of constipation. 3.  Patient to follow in clinic per recommendations after above.  Hyacinth Meeker, PA-C [facility] 03/22/2023, 9:29 AM  Cc: Gabriel Earing, FNP

## 2023-03-22 NOTE — Patient Instructions (Signed)
You have been scheduled for a colonoscopy. Please follow written instructions given to you at your visit today.  Please pick up your prep supplies at the pharmacy within the next 1-3 days. If you use inhalers (even only as needed), please bring them with you on the day of your procedure.  _______________________________________________________  If your blood pressure at your visit was 140/90 or greater, please contact your primary care physician to follow up on this.  _______________________________________________________  If you are age 12 or older, your body mass index should be between 23-30. Your Body mass index is 24.62 kg/m. If this is out of the aforementioned range listed, please consider follow up with your Primary Care Provider.  If you are age 24 or younger, your body mass index should be between 19-25. Your Body mass index is 24.62 kg/m. If this is out of the aformentioned range listed, please consider follow up with your Primary Care Provider.   ________________________________________________________  The [facility] providers would like to encourage you to use [facility] to communicate with providers for non-urgent requests or questions.  Due to long hold times on the telephone, sending your provider a message by [facility] may be a faster and more efficient way to get a response.  Please allow 48 business hours for a response.  Please remember that this is for non-urgent requests.  _______________________________________________________

## 2023-03-23 ENCOUNTER — Encounter: Payer: Self-pay | Admitting: Internal Medicine

## 2023-03-23 NOTE — Progress Notes (Signed)
I agree with the assessment and plan as outlined by Ms. Lemmon. 

## 2023-03-31 ENCOUNTER — Other Ambulatory Visit: Payer: Self-pay | Admitting: Family Medicine

## 2023-03-31 DIAGNOSIS — Z1231 Encounter for screening mammogram for malignant neoplasm of breast: Secondary | ICD-10-CM

## 2023-04-06 ENCOUNTER — Ambulatory Visit (AMBULATORY_SURGERY_CENTER): Payer: Medicare PPO | Admitting: Internal Medicine

## 2023-04-06 ENCOUNTER — Encounter: Payer: Self-pay | Admitting: Internal Medicine

## 2023-04-06 VITALS — BP 100/68 | HR 68 | Temp 96.6°F | Resp 19 | Ht 61.0 in | Wt 133.0 lb

## 2023-04-06 DIAGNOSIS — D120 Benign neoplasm of cecum: Secondary | ICD-10-CM

## 2023-04-06 DIAGNOSIS — D123 Benign neoplasm of transverse colon: Secondary | ICD-10-CM | POA: Diagnosis not present

## 2023-04-06 DIAGNOSIS — E7800 Pure hypercholesterolemia, unspecified: Secondary | ICD-10-CM | POA: Diagnosis not present

## 2023-04-06 DIAGNOSIS — I10 Essential (primary) hypertension: Secondary | ICD-10-CM | POA: Diagnosis not present

## 2023-04-06 DIAGNOSIS — Z1211 Encounter for screening for malignant neoplasm of colon: Secondary | ICD-10-CM | POA: Diagnosis not present

## 2023-04-06 DIAGNOSIS — R195 Other fecal abnormalities: Secondary | ICD-10-CM | POA: Diagnosis not present

## 2023-04-06 MED ORDER — SODIUM CHLORIDE 0.9 % IV SOLN
500.00 mL | INTRAVENOUS | Status: AC
Start: 2023-04-06 — End: ?

## 2023-04-06 NOTE — Progress Notes (Signed)
GASTROENTEROLOGY PROCEDURE H&P NOTE   Primary Care Physician: Gabriel Earing, FNP    Reason for Procedure:   Positive Cologuard  Plan:    Colonoscopy  Patient is appropriate for endoscopic procedure(s) in the ambulatory ([facility]) setting.  The nature of the procedure, as well as the risks, benefits, and alternatives were carefully and thoroughly reviewed with the patient. Ample time for discussion and questions allowed. The patient understood, was satisfied, and agreed to proceed.     HPI: Jackie Jackson is a 77 y.o. female who presents for colonoscopy for evaluation of positive Cologuard .  Patient was most recently seen in the Gastroenterology Clinic on 03/22/23.  No interval change in medical history since that appointment. Please refer to that note for full details regarding GI history and clinical presentation.   Past Medical History:  Diagnosis Date   Acid reflux    Allergy    seasonal   Angio-edema    High cholesterol    Hypertension    Osteopenia    2006 -1.4, 2009 -1.9, 2011 -1.9    Past Surgical History:  Procedure Laterality Date   BREAST BIOPSY Left 2012   CATARACT EXTRACTION W/PHACO Left 03/22/2017   Procedure: CATARACT EXTRACTION PHACO AND INTRAOCULAR LENS PLACEMENT (IOC);  Surgeon: Gemma Payor, MD;  Location: [facility];  Service: Ophthalmology;  Laterality: Left;  CDE:  8.65   CATARACT EXTRACTION W/PHACO Right 04/05/2017   Procedure: CATARACT EXTRACTION PHACO AND INTRAOCULAR LENS PLACEMENT (IOC);  Surgeon: Gemma Payor, MD;  Location: [facility];  Service: Ophthalmology;  Laterality: Right;  CDE:  5.23    SKIN CANCER DESTRUCTION  2012   rt. lower leg   TUBAL LIGATION      Prior to Admission medications   Medication Sig Start Date End Date Taking? Authorizing Provider  acetaminophen (TYLENOL) 500 MG tablet Take 500 mg by mouth 2 (two) times daily as needed for mild pain or headache.     [provider]  Calcium Carb-Cholecalciferol (CALCIUM 1000 + D  PO) Take 1 tablet by mouth daily.    [provider]  doxycycline (VIBRA-TABS) 100 MG tablet Take 1 tablet (100 mg total) by mouth 2 (two) times daily. 1 po bid 03/16/23   Daphine Deutscher, Mary-Madalina, FNP  EPINEPHrine (EPIPEN 2-PAK) 0.3 mg/0.3 mL IJ SOAJ injection Inject 0.3 mg into the muscle as needed for anaphylaxis. Patient not taking: Reported on 03/22/2023 03/02/22   Gabriel Earing, FNP  ezetimibe (ZETIA) 10 MG tablet Take 1 tablet (10 mg total) by mouth daily. 04/23/22   Gabriel Earing, FNP  fluticasone (FLONASE) 50 MCG/ACT nasal spray Place 2 sprays into both nostrils daily. 09/15/21   Gabriel Earing, FNP  loratadine (CLARITIN) 10 MG tablet Take 1 tablet (10 mg total) by mouth daily. TAKE ONE (1) TABLET EACH DAY Strength: 10 mg 09/15/21   Gabriel Earing, FNP  Multiple Vitamin (MULTIVITAMIN WITH MINERALS) TABS tablet Take 1 tablet by mouth daily.    [provider]  Omega-3 Fatty Acids (FISH OIL) 1000 MG CAPS Take by mouth.    [provider]    Current Outpatient Medications  Medication Sig Dispense Refill   acetaminophen (TYLENOL) 500 MG tablet Take 500 mg by mouth 2 (two) times daily as needed for mild pain or headache.      Calcium Carb-Cholecalciferol (CALCIUM 1000 + D PO) Take 1 tablet by mouth daily.     doxycycline (VIBRA-TABS) 100 MG tablet Take 1 tablet (100 mg total)  by mouth 2 (two) times daily. 1 po bid 20 tablet 0   EPINEPHrine (EPIPEN 2-PAK) 0.3 mg/0.3 mL IJ SOAJ injection Inject 0.3 mg into the muscle as needed for anaphylaxis. (Patient not taking: Reported on 03/22/2023) 1 each 2   ezetimibe (ZETIA) 10 MG tablet Take 1 tablet (10 mg total) by mouth daily. 90 tablet 3   fluticasone (FLONASE) 50 MCG/ACT nasal spray Place 2 sprays into both nostrils daily. 16 g 6   loratadine (CLARITIN) 10 MG tablet Take 1 tablet (10 mg total) by mouth daily. TAKE ONE (1) TABLET EACH DAY Strength: 10 mg 90 tablet 3   Multiple Vitamin (MULTIVITAMIN WITH MINERALS)  TABS tablet Take 1 tablet by mouth daily.     Omega-3 Fatty Acids (FISH OIL) 1000 MG CAPS Take by mouth.     Current Facility-Administered Medications  Medication Dose Route Frequency Provider Last Rate Last Admin   0.9 %  sodium chloride infusion  500 mL Intravenous Continuous Imogene Burn, MD        Allergies as of 04/06/2023 - Review Complete 04/06/2023  Allergen Reaction Noted   Pantoprazole sodium Anaphylaxis and Swelling    Alpha-gal  09/02/2022   Amoxicillin Other (See Comments) 03/16/2023   Statins  12/04/2018    Family History  Problem Relation Age of Onset   Heart disease Mother    Heart disease Father    Lung cancer Sister    Diabetes Brother    Heart disease Maternal Grandmother    Healthy Daughter    Healthy Son    Colon cancer Neg Hx    Stomach cancer Neg Hx    Esophageal cancer Neg Hx     Social History   Socioeconomic History   Marital status: Married    Spouse name: Darcel Bayley   Number of children: 2   Years of education: Not on file   Highest education level: Not on file  Occupational History   Occupation: retired     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   Tobacco Use   Smoking status: Never   Smokeless tobacco: Never  Vaping Use   Vaping Use: Never used  Substance and Sexual Activity   Alcohol use: No   Drug use: No   Sexual activity: Not Currently  Other Topics Concern   Not on file  Social History Narrative   Not on file   Social Determinants of Health   Financial Resource Strain: Low Risk  (09/02/2022)   Overall Financial Resource Strain (CARDIA)    Difficulty of Paying Living Expenses: Not hard at all  Food Insecurity: No Food Insecurity (09/02/2022)   Hunger Vital Sign    Worried About Running Out of Food in the Last Year: Never true    Ran Out of Food in the Last Year: Never true  Transportation Needs: No Transportation Needs (09/02/2022)   PRAPARE - Administrator, Civil Service (Medical): No    Lack of  Transportation (Non-Medical): No  Physical Activity: Sufficiently Active (09/02/2022)   Exercise Vital Sign    Days of Exercise per Week: 3 days    Minutes of Exercise per Session: 60 min  Stress: No Stress Concern Present (09/02/2022)   [employer] of Occupational Health - Occupational Stress Questionnaire    Feeling of Stress : Not at all  Social Connections: Not on file  Intimate Partner Violence: Not At Risk (09/02/2022)   Humiliation, Afraid, Rape, and Kick questionnaire    Fear  of Current or Ex-Partner: No    Emotionally Abused: No    Physically Abused: No    Sexually Abused: No    Physical Exam: Vital signs in last 24 hours: BP (!) 167/83   Pulse 80   Temp (!) 96.6 F (35.9 C)   Ht 5\' 1"  (1.549 m)   Wt 133 lb (60.3 kg)   LMP 08/04/1997   SpO2 98%   BMI 25.13 kg/m  GEN: NAD EYE: Sclerae anicteric ENT: MMM CV: Non-tachycardic Pulm: No increased WOB GI: Soft NEURO:  Alert & Oriented   Eulah Pont, MD [facility]   04/06/2023 9:27 AM

## 2023-04-06 NOTE — Patient Instructions (Addendum)
Resume all of your previous medications today as ordered.  Read the discharge instructions.  YOU HAD AN ENDOSCOPIC PROCEDURE TODAY AT THE [facility]:   Refer to the procedure report that was given to you for any specific questions about what was found during the examination.  If the procedure report does not answer your questions, please call your gastroenterologist to clarify.  If you requested that your care partner not be given the details of your procedure findings, then the procedure report has been included in a sealed envelope for you to review at your convenience later.  YOU SHOULD EXPECT: Some feelings of bloating in the abdomen. Passage of more gas than usual.  Walking can help get rid of the air that was put into your GI tract during the procedure and reduce the bloating. If you had a lower endoscopy (such as a colonoscopy or flexible sigmoidoscopy) you may notice spotting of blood in your stool or on the toilet paper. If you underwent a bowel prep for your procedure, you may not have a normal bowel movement for a few days.  Please Note:  You might notice some irritation and congestion in your nose or some drainage.  This is from the oxygen used during your procedure.  There is no need for concern and it should clear up in a day or so.  SYMPTOMS TO REPORT IMMEDIATELY:  Following lower endoscopy (colonoscopy or flexible sigmoidoscopy):  Excessive amounts of blood in the stool  Significant tenderness or worsening of abdominal pains  Swelling of the abdomen that is new, acute  Fever of 100F or higher  For urgent or emergent issues, a gastroenterologist can be reached at any hour by calling [phone]. Do not use MyChart messaging for urgent concerns.    DIET:  We do recommend a small meal at first, but then you may proceed to your regular diet.  Drink plenty of fluids but you should avoid alcoholic beverages for 24 hours. Try to eat plenty of fiber and drink plenty of  water.  Try benefiber once or twice daily.  Use a capful of miralax in the evening as needed for constipations per Dr Leonides Schanz.  ACTIVITY:  You should plan to take it easy for the rest of today and you should NOT DRIVE or use heavy machinery until tomorrow (because of the sedation medicines used during the test).    FOLLOW UP: Our staff will call the number listed on your records the next business day following your procedure.  We will call around 7:15- 8:00 am to check on you and address any questions or concerns that you may have regarding the information given to you following your procedure. If we do not reach you, we will leave a message.     If any biopsies were taken you will be contacted by phone or by letter within the next 1-3 weeks.  Please call us at [phone] if you have not heard about the biopsies in 3 weeks.    SIGNATURES/CONFIDENTIALITY: You and/or your care partner have signed paperwork which will be entered into your electronic medical record.  These signatures attest to the fact that that the information above on your After Visit Summary has been reviewed and is understood.  Full responsibility of the confidentiality of this discharge information lies with you and/or your care-partner.

## 2023-04-06 NOTE — Progress Notes (Signed)
Sedate, gd SR, tolerated procedure well, VSS, report to RN 

## 2023-04-06 NOTE — Op Note (Signed)
[facility] Patient Name: Jackie Jackson Procedure Date: 04/06/2023 10:02 AM MRN: [id number] Endoscopist: Madelyn Brunner [place] , , [id number] Age: 77 Referring MD:  Date of Birth: 04/08/1946 Gender: Female Account #: [id number] Procedure:                Colonoscopy Indications:              Positive Cologuard test Medicines:                Monitored Anesthesia Care Procedure:                Pre-Anesthesia Assessment:                           - Prior to the procedure, a History and Physical                            was performed, and patient medications and                            allergies were reviewed. The patient's tolerance of                            previous anesthesia was also reviewed. The risks                            and benefits of the procedure and the sedation                            options and risks were discussed with the patient.                            All questions were answered, and informed consent                            was obtained. Prior Anticoagulants: The patient has                            taken no anticoagulant or antiplatelet agents. ASA                            Grade Assessment: II - A patient with mild systemic                            disease. After reviewing the risks and benefits,                            the patient was deemed in satisfactory condition to                            undergo the procedure.                           After obtaining informed consent, the colonoscope  was passed under direct vision. Throughout the                            procedure, the patient's blood pressure, pulse, and                            oxygen saturations were monitored continuously. The                            Olympus Scope SN: [id number] was introduced through                            the anus and advanced to the the cecum, identified                            by appendiceal orifice and  ileocecal valve. The                            colonoscopy was performed without difficulty. The                            patient tolerated the procedure well. The quality                            of the bowel preparation was good. The ileocecal                            valve, appendiceal orifice, and rectum were                            photographed. Scope In: 10:13:59 AM Scope Out: 10:40:31 AM Scope Withdrawal Time: 0 hours 21 minutes 32 seconds  Total Procedure Duration: 0 hours 26 minutes 32 seconds  Findings:                 A 15 mm polyp was found in the cecum. The polyp was                            sessile. Preparations were made for mucosal                            resection. Saline was injected to raise the lesion.                            Hot snare mucosal resection was performed.                            Resection and retrieval were complete.                           Two sessile polyps were found in the transverse                            colon and cecum. The polyps were 4  to 7 mm in size.                            These polyps were removed with a cold snare.                            Resection and retrieval were complete.                           Multiple diverticula were found in the sigmoid                            colon.                           Non-bleeding internal hemorrhoids were found during                            retroflexion. Complications:            No immediate complications. Estimated Blood Loss:     Estimated blood loss was minimal. Impression:               - One 15 mm polyp in the cecum, removed with                            mucosal resection. Resected and retrieved.                           - Two 4 to 7 mm polyps in the transverse colon and                            in the cecum, removed with a cold snare. Resected                            and retrieved.                           - Diverticulosis in the sigmoid colon.                            - Non-bleeding internal hemorrhoids.                           - Mucosal resection was performed. Resection and                            retrieval were complete. Recommendation:           - Discharge patient to home (with escort).                           - Await pathology results.                           - The findings and recommendations were discussed  with the patient. Dr Particia Lather "Alan Ripper" Leonides Schanz,  04/06/2023 10:45:13 AM

## 2023-04-07 ENCOUNTER — Telehealth: Payer: Self-pay

## 2023-04-07 NOTE — Telephone Encounter (Signed)
  Follow up Call-     04/06/2023    9:11 AM  Call back number  Post procedure Call Back phone  # [phone]  Permission to leave phone message Yes     Patient questions:  Do you have a fever, pain , or abdominal swelling? No. Pain Score  0 *  Have you tolerated food without any problems? Yes.    Have you been able to return to your normal activities? Yes.    Do you have any questions about your discharge instructions: Diet   No. Medications  No. Follow up visit  No.  Do you have questions or concerns about your Care? No.  Actions: * If pain score is 4 or above: No action needed, pain <4.

## 2023-04-08 ENCOUNTER — Encounter: Payer: Self-pay | Admitting: Internal Medicine

## 2023-04-19 ENCOUNTER — Other Ambulatory Visit: Payer: Self-pay | Admitting: Family Medicine

## 2023-04-19 DIAGNOSIS — Z789 Other specified health status: Secondary | ICD-10-CM

## 2023-04-19 DIAGNOSIS — E785 Hyperlipidemia, unspecified: Secondary | ICD-10-CM

## 2023-04-28 ENCOUNTER — Telehealth: Payer: Self-pay | Admitting: Internal Medicine

## 2023-04-28 NOTE — Telephone Encounter (Signed)
Read the pathology letter from Dr Leonides Schanz to the patient. Questions invited. No questions at this time.

## 2023-04-28 NOTE — Telephone Encounter (Signed)
Patient called in regards to results. Please advise.   Thank you

## 2023-04-30 ENCOUNTER — Ambulatory Visit
Admission: RE | Admit: 2023-04-30 | Discharge: 2023-04-30 | Disposition: A | Discharge status: Home / Self Care | Payer: Medicare PPO | Source: Ambulatory Visit | Attending: Family Medicine | Admitting: Family Medicine

## 2023-04-30 DIAGNOSIS — Z1231 Encounter for screening mammogram for malignant neoplasm of breast: Secondary | ICD-10-CM | POA: Diagnosis not present

## 2023-06-21 ENCOUNTER — Encounter: Payer: Medicare PPO | Admitting: Family Medicine

## 2023-06-24 ENCOUNTER — Encounter: Payer: Self-pay | Admitting: Family Medicine

## 2023-06-24 ENCOUNTER — Ambulatory Visit (INDEPENDENT_AMBULATORY_CARE_PROVIDER_SITE_OTHER): Payer: Medicare PPO | Admitting: Family Medicine

## 2023-06-24 VITALS — BP 120/79 | HR 71 | Temp 97.6°F | Ht 61.0 in | Wt 132.5 lb

## 2023-06-24 DIAGNOSIS — R030 Elevated blood-pressure reading, without diagnosis of hypertension: Secondary | ICD-10-CM

## 2023-06-24 DIAGNOSIS — J301 Allergic rhinitis due to pollen: Secondary | ICD-10-CM | POA: Diagnosis not present

## 2023-06-24 DIAGNOSIS — E782 Mixed hyperlipidemia: Secondary | ICD-10-CM

## 2023-06-24 DIAGNOSIS — Z0001 Encounter for general adult medical examination with abnormal findings: Secondary | ICD-10-CM

## 2023-06-24 DIAGNOSIS — E559 Vitamin D deficiency, unspecified: Secondary | ICD-10-CM

## 2023-06-24 DIAGNOSIS — Z0000 Encounter for general adult medical examination without abnormal findings: Secondary | ICD-10-CM

## 2023-06-24 DIAGNOSIS — Z789 Other specified health status: Secondary | ICD-10-CM | POA: Diagnosis not present

## 2023-06-24 MED ORDER — LORATADINE 10 MG PO TABS: 10.0000 mg | ORAL_TABLET | Freq: Every day | ORAL | 3 refills | Status: AC

## 2023-06-24 MED ORDER — EZETIMIBE 10 MG PO TABS
10.0000 mg | ORAL_TABLET | Freq: Every day | ORAL | 3 refills | Status: AC
Start: 2023-06-24 — End: ?

## 2023-06-24 NOTE — Progress Notes (Signed)
Complete physical exam  Patient: Jackie Jackson   DOB: 06-07-1946   77 y.o. Female  MRN: [id number]  Subjective:    Chief Complaint  Patient presents with   Annual Exam    Jackie Jackson is a 77 y.o. female who presents today for a complete physical exam. She reports consuming a generally healthy diet. Home exercise routine includes aerobics and silver sneakers 4x a week. She generally feels fairly well. She reports sleeping fairly well. She does not have additional problems to discuss today.   She is taking zetia for cholesterol. She is interested in coming off of this potentially as she has made a lot of changes to her diet and has been consistently exercising.   Most recent fall risk assessment:    03/16/2023    3:25 PM  Fall Risk   Falls in the past year? 0     Most recent depression screenings:    03/16/2023    3:25 PM 12/18/2022   10:27 AM  PHQ 2/9 Scores  PHQ - 2 Score 0 0  PHQ- 9 Score 0 0    Vision:Within last year and Dental: No current dental problems and Receives regular dental care  Past Medical History:  Diagnosis Date   Acid reflux    Allergy    seasonal   Angio-edema    High cholesterol    Hypertension    Osteopenia    2006 -1.4, 2009 -1.9, 2011 -1.9      Patient Care Team: Gabriel Earing, FNP as PCP - General (Family Medicine) Harrington Challenger, NP (Inactive) as Nurse Practitioner (Obstetrics and Gynecology) Smitty Cords, OD [facility])   Outpatient Medications Prior to Visit  Medication Sig   acetaminophen (TYLENOL) 500 MG tablet Take 500 mg by mouth 2 (two) times daily as needed for mild pain or headache.    Calcium Carb-Cholecalciferol (CALCIUM 1000 + D PO) Take 1 tablet by mouth daily.   EPINEPHrine (EPIPEN 2-PAK) 0.3 mg/0.3 mL IJ SOAJ injection Inject 0.3 mg into the muscle as needed for anaphylaxis.   ezetimibe (ZETIA) 10 MG tablet TAKE ONE (1) TABLET BY MOUTH EVERY DAY   fluticasone (FLONASE) 50 MCG/ACT nasal spray Place 2 sprays  into both nostrils daily.   loratadine (CLARITIN) 10 MG tablet Take 1 tablet (10 mg total) by mouth daily. TAKE ONE (1) TABLET EACH DAY Strength: 10 mg   Multiple Vitamin (MULTIVITAMIN WITH MINERALS) TABS tablet Take 1 tablet by mouth daily.   Omega-3 Fatty Acids (FISH OIL) 1000 MG CAPS Take by mouth.   Facility-Administered Medications Prior to Visit  Medication Dose Route Frequency Provider   0.9 %  sodium chloride infusion  500 mL Intravenous Continuous Imogene Burn, MD    ROS Negative unless specially indicated above in HPI.     Objective:     BP 120/79 Comment: at home reading per pt  Pulse 71   Temp 97.6 F (36.4 C) (Temporal)   Ht 5\' 1"  (1.549 m)   Wt 132 lb 8 oz (60.1 kg)   LMP 08/04/1997   SpO2 96%   BMI 25.04 kg/m    Physical Exam Vitals and nursing note reviewed.  Constitutional:      General: She is not in acute distress.    Appearance: Normal appearance. She is not ill-appearing, toxic-appearing or diaphoretic.  HENT:     Head: Normocephalic.     Right Ear: Tympanic membrane, ear canal and external ear normal.  Left Ear: Tympanic membrane, ear canal and external ear normal.     Nose: Nose normal.     Mouth/Throat:     Mouth: Mucous membranes are moist.     Pharynx: Oropharynx is clear.  Eyes:     Extraocular Movements: Extraocular movements intact.     Conjunctiva/sclera: Conjunctivae normal.     Pupils: Pupils are equal, round, and reactive to light.  Neck:     Thyroid: No thyroid mass, thyromegaly or thyroid tenderness.  Cardiovascular:     Rate and Rhythm: Normal rate and regular rhythm.     Pulses: Normal pulses.     Heart sounds: Normal heart sounds. No murmur heard.    No friction rub. No gallop.  Pulmonary:     Effort: Pulmonary effort is normal.     Breath sounds: Normal breath sounds.  Abdominal:     General: Bowel sounds are normal. There is no distension.     Palpations: Abdomen is soft. There is no mass.     Tenderness: There is  no abdominal tenderness. There is no guarding.  Musculoskeletal:        General: Normal range of motion.     Cervical back: Normal range of motion and neck supple. No tenderness.     Right lower leg: No edema.     Left lower leg: No edema.  Skin:    General: Skin is warm and dry.     Capillary Refill: Capillary refill takes less than 2 seconds.     Findings: No lesion or rash.  Neurological:     General: No focal deficit present.     Mental Status: She is alert and oriented to person, place, and time.     Cranial Nerves: No cranial nerve deficit.     Motor: No weakness.     Coordination: Coordination normal.     Gait: Gait normal.  Psychiatric:        Mood and Affect: Mood normal.        Behavior: Behavior normal.        Thought Content: Thought content normal.        Judgment: Judgment normal.      No results found for any visits on 06/24/23.     Assessment & Plan:    Routine Health Maintenance and Physical Exam  Janetta "Gigi Gin" was seen today for annual exam.  Diagnoses and all orders for this visit:  Routine general medical examination at a health care facility  Blood pressure elevated without history of HTN BP is normal at home. Labs pending as below.  -     CBC with Differential/Platelet -     CMP14+EGFR -     TSH  Mixed hyperlipidemia Statin intolerance Lipid panel pending. Discussed trailing off zetia if lipid panel looks good today.  -     Lipid panel -     ezetimibe (ZETIA) 10 MG tablet; Take 1 tablet (10 mg total) by mouth daily.  Vitamin D deficiency On supplement.  -     VITAMIN D 25 Hydroxy (Vit-D Deficiency, Fractures)  Chronic seasonal allergic rhinitis due to pollen Well controlled on current regimen.  -     loratadine (CLARITIN) 10 MG tablet; Take 1 tablet (10 mg total) by mouth daily. TAKE ONE (1) TABLET EACH DAY Strength: 10 mg   Immunization History  Administered Date(s) Administered   Fluad Quad(high Dose 65+) 08/11/2021, 07/29/2022    Influenza, High Dose Seasonal PF 08/26/2016, 10/29/2017, 08/10/2018, 08/17/2019  Influenza-Unspecified 08/17/2019, 08/21/2020   Moderna Covid-19 Vaccine Bivalent Booster 81yrs & up 08/12/2021   Moderna Sars-Covid-2 Vaccination 11/02/2019, 12/01/2019, 08/21/2020   PNEUMOCOCCAL CONJUGATE-20 09/23/2021   Pneumococcal Conjugate-13 10/29/2017   Zoster Recombinant(Shingrix) 11/17/2021, 02/05/2022    Health Maintenance  Topic Date Due   DTaP/Tdap/Td (1 - Tdap) Never done   Hepatitis C Screening  12/18/2023 (Originally 03/06/1964)   INFLUENZA VACCINE  12/27/2023 (Originally 04/29/2023)   COVID-19 Vaccine (5 - 2023-24 season) 07/09/2024 (Originally 05/30/2023)   Medicare Annual Wellness (AWV)  09/03/2023   DEXA SCAN  09/25/2023   Colonoscopy  04/05/2026   Pneumonia Vaccine 2+ Years old  Completed   Zoster Vaccines- Shingrix  Completed   HPV VACCINES  Aged Out    Discussed health benefits of physical activity, and encouraged her to engage in regular exercise appropriate for her age and condition.  Problem List Items Addressed This Visit       Respiratory   Chronic seasonal allergic rhinitis due to pollen   Relevant Medications   loratadine (CLARITIN) 10 MG tablet     Other   Pure hypercholesterolemia   Relevant Medications   ezetimibe (ZETIA) 10 MG tablet   Statin intolerance   Relevant Medications   ezetimibe (ZETIA) 10 MG tablet   Vitamin D deficiency   Relevant Orders   VITAMIN D 25 Hydroxy (Vit-D Deficiency, Fractures)   Blood pressure elevated without history of HTN   Relevant Orders   CBC with Differential/Platelet   CMP14+EGFR   TSH   Other Visit Diagnoses     Routine general medical examination at a health care facility    -  Primary      Return in about 1 year (around 06/23/2024) for CPE.   The patient indicates understanding of these issues and agrees with the plan.  Gabriel Earing, FNP

## 2023-06-24 NOTE — Patient Instructions (Signed)

## 2023-06-25 LAB — CBC WITH DIFFERENTIAL/PLATELET
Basophils Absolute: 0.1 10*3/uL (ref 0.0–0.2)
Basos: 1 %
EOS (ABSOLUTE): 0 10*3/uL (ref 0.0–0.4)
Eos: 1 %
Hematocrit: 36 % (ref 34.0–46.6)
Hemoglobin: 11.9 g/dL (ref 11.1–15.9)
Immature Grans (Abs): 0 10*3/uL (ref 0.0–0.1)
Immature Granulocytes: 0 %
Lymphocytes Absolute: 2.8 10*3/uL (ref 0.7–3.1)
Lymphs: 42 %
MCH: 30.2 pg (ref 26.6–33.0)
MCHC: 33.1 g/dL (ref 31.5–35.7)
MCV: 91 fL (ref 79–97)
Monocytes Absolute: 0.4 10*3/uL (ref 0.1–0.9)
Monocytes: 6 %
Neutrophils Absolute: 3.3 10*3/uL (ref 1.4–7.0)
Neutrophils: 50 %
Platelets: 270 10*3/uL (ref 150–450)
RBC: 3.94 x10E6/uL (ref 3.77–5.28)
RDW: 12.8 % (ref 11.7–15.4)
WBC: 6.6 10*3/uL (ref 3.4–10.8)

## 2023-06-25 LAB — LIPID PANEL
Chol/HDL Ratio: 3.1 {ratio} (ref 0.0–4.4)
Cholesterol, Total: 244 mg/dL — ABNORMAL HIGH (ref 100–199)
HDL: 79 mg/dL (ref >39)
LDL Chol Calc (NIH): 152 mg/dL — ABNORMAL HIGH (ref 0–99)
Triglycerides: 76 mg/dL (ref 0–149)
VLDL Cholesterol Cal: 13 mg/dL (ref 5–40)

## 2023-06-25 LAB — CMP14+EGFR
ALT: 17 [IU]/L (ref 0–32)
AST: 23 [IU]/L (ref 0–40)
Albumin: 4.5 g/dL (ref 3.8–4.8)
Alkaline Phosphatase: 76 [IU]/L (ref 44–121)
BUN/Creatinine Ratio: 13 (ref 12–28)
BUN: 13 mg/dL (ref 8–27)
Bilirubin Total: <0.2 mg/dL (ref 0.0–1.2)
CO2: 24 mmol/L (ref 20–29)
Calcium: 10.4 mg/dL — ABNORMAL HIGH (ref 8.7–10.3)
Chloride: 102 mmol/L (ref 96–106)
Creatinine, Ser: 1 mg/dL (ref 0.57–1.00)
Globulin, Total: 2.2 g/dL (ref 1.5–4.5)
Glucose: 84 mg/dL (ref 70–99)
Potassium: 5.1 mmol/L (ref 3.5–5.2)
Sodium: 141 mmol/L (ref 134–144)
Total Protein: 6.7 g/dL (ref 6.0–8.5)
eGFR: 58 mL/min/{1.73_m2} — ABNORMAL LOW (ref >59)

## 2023-06-25 LAB — TSH: TSH: 2 u[IU]/mL (ref 0.450–4.500)

## 2023-06-25 LAB — VITAMIN D 25 HYDROXY (VIT D DEFICIENCY, FRACTURES): Vit D, 25-Hydroxy: 36.7 ng/mL (ref 30.0–100.0)

## 2023-06-30 ENCOUNTER — Other Ambulatory Visit: Payer: Self-pay | Admitting: *Deleted

## 2023-06-30 DIAGNOSIS — Z789 Other specified health status: Secondary | ICD-10-CM

## 2023-06-30 DIAGNOSIS — E782 Mixed hyperlipidemia: Secondary | ICD-10-CM

## 2023-06-30 MED ORDER — EZETIMIBE 10 MG PO TABS: 10.0000 mg | ORAL_TABLET | Freq: Every day | ORAL | 3 refills | Status: DC

## 2023-07-13 DIAGNOSIS — H524 Presbyopia: Secondary | ICD-10-CM | POA: Diagnosis not present

## 2023-07-13 DIAGNOSIS — H35033 Hypertensive retinopathy, bilateral: Secondary | ICD-10-CM | POA: Diagnosis not present

## 2023-07-13 DIAGNOSIS — H52223 Regular astigmatism, bilateral: Secondary | ICD-10-CM | POA: Diagnosis not present

## 2023-07-13 DIAGNOSIS — H26491 Other secondary cataract, right eye: Secondary | ICD-10-CM | POA: Diagnosis not present

## 2023-07-13 DIAGNOSIS — Z961 Presence of intraocular lens: Secondary | ICD-10-CM | POA: Diagnosis not present

## 2023-09-09 ENCOUNTER — Ambulatory Visit: Payer: Medicare PPO

## 2023-09-09 VITALS — Ht 61.0 in | Wt 132.0 lb

## 2023-09-09 DIAGNOSIS — Z0000 Encounter for general adult medical examination without abnormal findings: Secondary | ICD-10-CM

## 2023-09-09 NOTE — Progress Notes (Signed)
Subjective:   Jackie Jackson is a 77 y.o. female who presents for Medicare Annual (Subsequent) preventive examination.  Visit Complete: Virtual I connected with  Jackie Jackson on 09/09/23 by a audio enabled telemedicine application and verified that I am speaking with the correct person using two identifiers.  Patient Location: Home  Provider Location: Home Office  I discussed the limitations of evaluation and management by telemedicine. The patient expressed understanding and agreed to proceed.  Vital Signs: Because this visit was a virtual/telehealth visit, some criteria may be missing or patient reported. Any vitals not documented were not able to be obtained and vitals that have been documented are patient reported.  Cardiac Risk Factors include: advanced age (>18men, >36 women);dyslipidemia     Objective:    Today's Vitals   09/09/23 1002  Weight: 132 lb (59.9 kg)  Height: 5\' 1"  (1.549 m)   Body mass index is 24.94 kg/m.     09/09/2023   10:11 AM 09/02/2022   11:15 AM 07/24/2021    1:46 PM 04/19/2018    3:08 PM 03/22/2017   11:22 AM 03/12/2017    9:07 AM  Advanced Directives  Does Patient Have a Medical Advance Directive? No Yes No No Yes Yes  Type of Advance Directive  Living will    Healthcare Power of Attorney  Does patient want to make changes to medical advance directive?  No - Patient declined      Copy of Healthcare Power of Attorney in Chart?     No - copy requested No - copy requested  Would patient like information on creating a medical advance directive? Yes (MAU/Ambulatory/Procedural Areas - Information given)  No - Patient declined Yes (MAU/Ambulatory/Procedural Areas - Information given);No - Patient declined      Current Medications (verified) Outpatient Encounter Medications as of 09/09/2023  Medication Sig   acetaminophen (TYLENOL) 500 MG tablet Take 500 mg by mouth 2 (two) times daily as needed for mild pain or headache.    Calcium  Carb-Cholecalciferol (CALCIUM 1000 + D PO) Take 1 tablet by mouth daily.   EPINEPHrine (EPIPEN 2-PAK) 0.3 mg/0.3 mL IJ SOAJ injection Inject 0.3 mg into the muscle as needed for anaphylaxis.   ezetimibe (ZETIA) 10 MG tablet Take 1 tablet (10 mg total) by mouth daily.   fluticasone (FLONASE) 50 MCG/ACT nasal spray Place 2 sprays into both nostrils daily.   loratadine (CLARITIN) 10 MG tablet Take 1 tablet (10 mg total) by mouth daily. TAKE ONE (1) TABLET EACH DAY Strength: 10 mg   Multiple Vitamin (MULTIVITAMIN WITH MINERALS) TABS tablet Take 1 tablet by mouth daily.   Omega-3 Fatty Acids (FISH OIL) 1000 MG CAPS Take by mouth.   Facility-Administered Encounter Medications as of 09/09/2023  Medication   0.9 %  sodium chloride infusion    Allergies (verified) Pantoprazole sodium, Alpha-gal, Amoxicillin, and Statins   History: Past Medical History:  Diagnosis Date   Acid reflux    Allergy    seasonal   Angio-edema    High cholesterol    Hypertension    Osteopenia    2006 -1.4, 2009 -1.9, 2011 -1.9   Past Surgical History:  Procedure Laterality Date   BREAST BIOPSY Left 2012   CATARACT EXTRACTION W/PHACO Left 03/22/2017   Procedure: CATARACT EXTRACTION PHACO AND INTRAOCULAR LENS PLACEMENT (IOC);  Surgeon: Gemma Payor, MD;  Location: [facility];  Service: Ophthalmology;  Laterality: Left;  CDE:  8.65   CATARACT EXTRACTION W/PHACO Right 04/05/2017  Procedure: CATARACT EXTRACTION PHACO AND INTRAOCULAR LENS PLACEMENT (IOC);  Surgeon: Gemma Payor, MD;  Location: [facility];  Service: Ophthalmology;  Laterality: Right;  CDE:  5.23    SKIN CANCER DESTRUCTION  2012   rt. lower leg   TUBAL LIGATION     Family History  Problem Relation Age of Onset   Heart disease Mother    Heart disease Father    Lung cancer Sister    Diabetes Brother    Heart disease Maternal Grandmother    Healthy Daughter    Healthy Son    Colon cancer Neg Hx    Stomach cancer Neg Hx    Esophageal cancer Neg Hx     Social History   Socioeconomic History   Marital status: Married    Spouse name: Jackie Jackson   Number of children: 2   Years of education: Not on file   Highest education level: Not on file  Occupational History   Occupation: retired     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   Tobacco Use   Smoking status: Never   Smokeless tobacco: Never  Vaping Use   Vaping status: Never Used  Substance and Sexual Activity   Alcohol use: No   Drug use: No   Sexual activity: Not Currently  Other Topics Concern   Not on file  Social History Narrative   Not on file   Social Drivers of Health   Financial Resource Strain: Low Risk  (09/09/2023)   Overall Financial Resource Strain (CARDIA)    Difficulty of Paying Living Expenses: Not hard at all  Food Insecurity: No Food Insecurity (09/09/2023)   Hunger Vital Sign    Worried About Running Out of Food in the Last Year: Never true    Ran Out of Food in the Last Year: Never true  Transportation Needs: No Transportation Needs (09/09/2023)   PRAPARE - Administrator, Civil Service (Medical): No    Lack of Transportation (Non-Medical): No  Physical Activity: Sufficiently Active (09/09/2023)   Exercise Vital Sign    Days of Exercise per Week: 4 days    Minutes of Exercise per Session: 60 min  Stress: No Stress Concern Present (09/09/2023)   [employer] of Occupational Health - Occupational Stress Questionnaire    Feeling of Stress : Not at all  Social Connections: Socially Integrated (09/09/2023)   Social Connection and Isolation Panel [NHANES]    Frequency of Communication with Friends and Family: More than three times a week    Frequency of Social Gatherings with Friends and Family: Three times a week    Attends Religious Services: More than 4 times per year    Active Member of Clubs or Organizations: Yes    Attends Banker Meetings: 1 to 4 times per year    Marital Status: Married    Tobacco  Counseling Counseling given: Not Answered   Clinical Intake:  Pre-visit preparation completed: Yes  Pain : No/denies pain     Diabetes: No  How often do you need to have someone help you when you read instructions, pamphlets, or other written materials from your doctor or pharmacy?: 1 - Never  Interpreter Needed?: No  Information entered by :: Kandis Fantasia LPN   Activities of Daily Living    09/09/2023   10:11 AM  In your present state of health, do you have any difficulty performing the following activities:  Hearing? 0  Vision? 0  Difficulty concentrating or  making decisions? 0  Walking or climbing stairs? 0  Dressing or bathing? 0  Doing errands, shopping? 0  Preparing Food and eating ? N  Using the Toilet? N  In the past six months, have you accidently leaked urine? N  Do you have problems with loss of bowel control? N  Managing your Medications? N  Managing your Finances? N  Housekeeping or managing your Housekeeping? N    Patient Care Team: Gabriel Earing, FNP as PCP - General (Family Medicine) Harrington Challenger, NP (Inactive) as Nurse Practitioner (Obstetrics and Gynecology) Smitty Cords, OD (Optometry) [facility]  Indicate any recent Medical Services you may have received from other than [facility] providers in the past year (date may be approximate).     Assessment:   This is a routine wellness examination for [facility].  Hearing/Vision screen Hearing Screening - Comments:: Denies hearing difficulties   Vision Screening - Comments:: Wears rx glasses - up to date with routine eye exams with [facility]     Goals Addressed             This Visit's Progress    COMPLETED: Patient Stated       09/02/2022 AWV Goal: Fall Prevention  Over the next year, patient will decrease their risk for falls by: Using assistive devices, such as a cane or walker, as needed Identifying fall risks within their home and  correcting them by: Removing throw rugs Adding handrails to stairs or ramps Removing clutter and keeping a clear pathway throughout the home Increasing light, especially at night Adding shower handles/bars Raising toilet seat Identifying potential personal risk factors for falls: Medication side effects Incontinence/urgency Vestibular dysfunction Hearing loss Musculoskeletal disorders Neurological disorders Orthostatic hypotension       Remain active and independent        Depression Screen    09/09/2023   10:10 AM 06/24/2023    2:16 PM 03/16/2023    3:25 PM 12/18/2022   10:27 AM 09/02/2022   11:16 AM 09/02/2022   11:13 AM 04/22/2022    9:34 AM  PHQ 2/9 Scores  PHQ - 2 Score 0 0 0 0 0 0 0  PHQ- 9 Score  0 0 0   0    Fall Risk    09/09/2023   10:11 AM 06/24/2023    2:16 PM 03/16/2023    3:25 PM 12/18/2022   10:27 AM 09/02/2022   11:15 AM  Fall Risk   Falls in the past year? 0 0 0 0 0  Number falls in past yr: 0    0  Injury with Fall? 0    0  Risk for fall due to : No Fall Risks    No Fall Risks  Follow up Falls prevention discussed;Education provided;Falls evaluation completed    Falls evaluation completed    MEDICARE RISK AT HOME: Medicare Risk at Home Any stairs in or around the home?: No If so, are there any without handrails?: No Home free of loose throw rugs in walkways, pet beds, electrical cords, etc?: Yes Adequate lighting in your home to reduce risk of falls?: Yes Life alert?: No Use of a cane, walker or w/c?: No Grab bars in the bathroom?: Yes Shower chair or bench in shower?: No Elevated toilet seat or a handicapped toilet?: Yes  TIMED UP AND GO:  Was the test performed?  No    Cognitive Function:    04/19/2018    3:10 PM  MMSE -  Mini Mental State Exam  Orientation to time 5  Orientation to Place 5  Registration 3  Attention/ Calculation 5  Recall 1  Language- name 2 objects 2  Language- repeat 1  Language- follow 3 step command 3   Language- read & follow direction 1  Write a sentence 1  Copy design 1  Total score 28        09/09/2023   10:11 AM 09/02/2022   11:19 AM 07/24/2021    1:48 PM  6CIT Screen  What Year? 0 points 0 points 0 points  What month? 0 points 0 points 0 points  What time? 0 points 0 points 0 points  Count back from 20 0 points 0 points 2 points  Months in reverse 0 points 0 points 0 points  Repeat phrase 0 points 0 points 0 points  Total Score 0 points 0 points 2 points    Immunizations Immunization History  Administered Date(s) Administered   Fluad Quad(high Dose 65+) 08/11/2021, 07/29/2022   Influenza, High Dose Seasonal PF 08/26/2016, 10/29/2017, 08/10/2018, 08/17/2019   Influenza-Unspecified 08/17/2019, 08/21/2020, 07/29/2023   Moderna Covid-19 Vaccine Bivalent Booster 21yrs & up 08/12/2021   Moderna Sars-Covid-2 Vaccination 11/02/2019, 12/01/2019, 08/21/2020   PNEUMOCOCCAL CONJUGATE-20 09/23/2021   Pneumococcal Conjugate-13 10/29/2017   Zoster Recombinant(Shingrix) 11/17/2021, 02/05/2022    TDAP status: Due, Education has been provided regarding the importance of this vaccine. Advised may receive this vaccine at local pharmacy or Health Dept. Aware to provide a copy of the vaccination record if obtained from local pharmacy or Health Dept. Verbalized acceptance and understanding.  Flu Vaccine status: Up to date  Pneumococcal vaccine status: Up to date  Covid-19 vaccine status: Information provided on how to obtain vaccines.   Qualifies for Shingles Vaccine? Yes   Zostavax completed No   Shingrix Completed?: Yes  Screening Tests Health Maintenance  Topic Date Due   DTaP/Tdap/Td (1 - Tdap) Never done   Hepatitis C Screening  12/18/2023 (Originally 03/06/1964)   COVID-19 Vaccine (5 - 2024-25 season) 07/09/2024 (Originally 05/30/2023)   DEXA SCAN  09/25/2023   Medicare Annual Wellness (AWV)  09/08/2024   Colonoscopy  04/05/2026   Pneumonia Vaccine 5+ Years old  Completed    INFLUENZA VACCINE  Completed   Zoster Vaccines- Shingrix  Completed   HPV VACCINES  Aged Out    Health Maintenance  Health Maintenance Due  Topic Date Due   DTaP/Tdap/Td (1 - Tdap) Never done    Colorectal cancer screening: Type of screening: Colonoscopy. Completed 04/06/23. Repeat every 3 years  Mammogram status: Completed 04/30/23. Repeat every year  Bone Density status: Completed 09/24/21. Results reflect: Bone density results: OSTEOPENIA. Repeat every 2-3 years.  Lung Cancer Screening: (Low Dose CT Chest recommended if Age 43-80 years, 20 pack-year currently smoking OR have quit w/in 15years.) does not qualify.   Lung Cancer Screening Referral: n/a  Additional Screening:  Hepatitis C Screening: does qualify  Vision Screening: Recommended annual ophthalmology exams for early detection of glaucoma and other disorders of the eye. Is the patient up to date with their annual eye exam?  Yes  Who is the provider or what is the name of the office in which the patient attends annual eye exams? [facility] If pt is not established with a provider, would they like to be referred to a provider to establish care? No .   Dental Screening: Recommended annual dental exams for proper oral hygiene  Community Resource Referral / Chronic Care Management: CRR required  this visit?  No   CCM required this visit?  No     Plan:     I have personally reviewed and noted the following in the patient's chart:   Medical and social history Use of alcohol, tobacco or illicit drugs  Current medications and supplements including opioid prescriptions. Patient is not currently taking opioid prescriptions. Functional ability and status Nutritional status Physical activity Advanced directives List of other physicians Hospitalizations, surgeries, and ER visits in previous 12 months Vitals Screenings to include cognitive, depression, and falls Referrals and appointments  In addition, I have  reviewed and discussed with patient certain preventive protocols, quality metrics, and best practice recommendations. A written personalized care plan for preventive services as well as general preventive health recommendations were provided to patient.     Kandis Fantasia [place], [place]   [id number]   After Visit Summary: (Mail) Due to this being a telephonic visit, the after visit summary with patients personalized plan was offered to patient via mail   Nurse Notes: No concerns at this time

## 2023-09-09 NOTE — Patient Instructions (Signed)
Ms. Leavelle , Thank you for taking time to come for your Medicare Wellness Visit. I appreciate your ongoing commitment to your health goals. Please review the following plan we discussed and let me know if I can assist you in the future.   Referrals/Orders/Follow-Ups/Clinician Recommendations: Aim for 30 minutes of exercise or brisk walking, 6-8 glasses of water, and 5 servings of fruits and vegetables each day.  This is a list of the screening recommended for you and due dates:  Health Maintenance  Topic Date Due   DTaP/Tdap/Td vaccine (1 - Tdap) Never done   Hepatitis C Screening  12/18/2023*   Flu Shot  12/27/2023*   COVID-19 Vaccine (5 - 2024-25 season) 07/09/2024*   DEXA scan (bone density measurement)  09/25/2023   Medicare Annual Wellness Visit  09/08/2024   Colon Cancer Screening  04/05/2026   Pneumonia Vaccine  Completed   Zoster (Shingles) Vaccine  Completed   HPV Vaccine  Aged Out  *Topic was postponed. The date shown is not the original due date.    Advanced directives: (ACP Link)Information on Advanced Care Planning can be found at [facility] Advance Health Care Directives Advance Health Care Directives ([email or web address]   Next Medicare Annual Wellness Visit scheduled for next year: Yes

## 2023-11-24 ENCOUNTER — Ambulatory Visit: Payer: Medicare PPO | Admitting: Allergy & Immunology

## 2023-11-24 ENCOUNTER — Encounter: Payer: Self-pay | Admitting: Allergy & Immunology

## 2023-11-24 VITALS — BP 154/80 | HR 90 | Temp 98.3°F | Resp 12 | Ht 62.0 in | Wt 132.2 lb

## 2023-11-24 DIAGNOSIS — R748 Abnormal levels of other serum enzymes: Secondary | ICD-10-CM | POA: Diagnosis not present

## 2023-11-24 DIAGNOSIS — Z91018 Allergy to other foods: Secondary | ICD-10-CM | POA: Diagnosis not present

## 2023-11-24 DIAGNOSIS — J3089 Other allergic rhinitis: Secondary | ICD-10-CM

## 2023-11-24 DIAGNOSIS — L500 Allergic urticaria: Secondary | ICD-10-CM | POA: Diagnosis not present

## 2023-11-24 DIAGNOSIS — Z87892 Personal history of anaphylaxis: Secondary | ICD-10-CM

## 2023-11-24 MED ORDER — EPINEPHRINE 0.3 MG/0.3ML IJ SOAJ: 0.3000 mg | INTRAMUSCULAR | 2 refills | Status: AC | PRN

## 2023-11-24 NOTE — Progress Notes (Signed)
 FOLLOW UP  Date of Service/Encounter:  11/24/23   Assessment:   Lip swelling - improved since the last visit   Dry mouth - negative ANA and negative inflammatory markers   Perennial allergic rhinitis (cat, dog)  Plan/Recommendations:   Allergic rhinitis - Continue allergen avoidance measures directed toward cat and dog - Continue Claritin 10 mg once a day as needed for runny nose or itch - Consider saline nasal rinses as needed for nasal symptoms. Use this before any medicated nasal sprays for best result  2. Alpha gal allergy - We are going to recheck your levels.  - We can consider adding on Xolair for treatment of food allergies in the future. - Information on Xolair provided (we have used this drug for over 20 years for asthma, hives, and now food allergies).  - EpiPen renewed.   3. Hives - resolved  - Continue Claritin 10 mg one to two times daily. - You can change from one antihistamine to another every few months to help them maintain their effectiveness.  - Begin a journal of events that occurred for up to 6 hours before your symptoms began including foods and beverages consumed, soaps or perfumes you had contact with, and medications.  - One lab order has been placed to recheck your tryptase. We will call you when the result becomes available  4. Angioedema - resolved  - Previous workup was negative. - Keep track and take pictures of any further incidences of swelling - Continue triamcinolone 0.1% paste up to twice a day as needed for red, itchy areas  5. Dry mouth - Continue to hydrate and use the mouthwash from your dentist  6. Return in about 6 months (around 05/23/2024). You can have the follow up appointment with Dr. Dellis Anes or a Nurse Practicioner (our Nurse Practitioners are excellent and always have Physician oversight!).     Subjective:   Jackie Jackson is a 78 y.o. female presenting today for follow up of  Chief Complaint  Patient presents with    Follow-up    Started eating bacon a couple of weeks ago with out an issue.     Jackie Jackson has a history of the following: Patient Active Problem List   Diagnosis Date Noted   Vitamin D deficiency 06/24/2023   Blood pressure elevated without history of HTN 06/24/2023   Elevated serum tryptase 11/26/2022   Bug bite 11/26/2022   Allergic urticaria 07/01/2022   Perennial allergic rhinitis 07/01/2022   Allergy to alpha-gal 07/01/2022   Angio-edema 07/01/2022   Overweight (BMI 25.0-29.9) 09/15/2021   Statin intolerance 12/04/2018   Abnormal rhythmic movement of tongue 11/25/2018   Chronic seasonal allergic rhinitis due to pollen 11/05/2017   Cancer (HCC)    Osteopenia    Gastroesophageal reflux disease without esophagitis    Pure hypercholesterolemia     History obtained from: chart review and patient.  Discussed the use of AI scribe software for clinical note transcription with the patient and/or guardian, who gave verbal consent to proceed.  [name] is a 78 y.o. female presenting for a follow up visit.  She was last seen in February 2024 by Thermon Leyland, one of our nurse practitioners.  At that time, she continue to avoid red meat.  EpiPen was updated.  She was continued on Claritin daily to help with hives.  She had a previous workup for angioedema that was negative.  Her alpha gal panel was looking better, but was still very elevated.  Since the last visit, she has done well.   Allergic Rhinitis Symptom History: She remains on her Claritin which seems to be controlling her symptoms. She has not been on antibiotics at all for sinus or ear infections.   Food Allergy Symptom History: She continues to avoid alpha gal containing foods. We did discuss Xolair today and she is interested in pursuing this. She would like to get repeat testing performed first to see if she is going to lose the allergy over time.  Skin Symptom History: She has not had any hives or angioedema  attacks. She remains on the Claritin which seems to be working to control her symptoms. EpiPen is up to date. She does have TAC to use as needed for her outbreaks.   Otherwise, there have been no changes to her past medical history, surgical history, family history, or social history.    Review of systems otherwise negative other than that mentioned in the HPI.    Objective:   Blood pressure (!) 154/80, pulse 90, temperature 98.3 F (36.8 C), resp. rate 12, height 5\' 2"  (1.575 m), weight 132 lb 4 oz (60 kg), last menstrual period 08/04/1997, SpO2 98%. Body mass index is 24.19 kg/m.    Physical Exam Vitals reviewed.  Constitutional:      Appearance: She is well-developed.     Comments: Lovely. Interactive.   HENT:     Head: Normocephalic and atraumatic.     Right Ear: Tympanic membrane, ear canal and external ear normal. No drainage, swelling or tenderness. Tympanic membrane is not injected, scarred, erythematous, retracted or bulging.     Left Ear: Tympanic membrane, ear canal and external ear normal. No drainage, swelling or tenderness. Tympanic membrane is not injected, scarred, erythematous, retracted or bulging.     Nose: No nasal deformity, septal deviation, mucosal edema or rhinorrhea.     Right Turbinates: Enlarged, swollen and pale.     Left Turbinates: Enlarged, swollen and pale.     Right Sinus: No maxillary sinus tenderness or frontal sinus tenderness.     Left Sinus: No maxillary sinus tenderness or frontal sinus tenderness.     Comments: No polyps.     Mouth/Throat:     Mouth: Mucous membranes are not pale and not dry.     Pharynx: Uvula midline.  Eyes:     General:        Right eye: No discharge.        Left eye: No discharge.     Conjunctiva/sclera: Conjunctivae normal.     Right eye: Right conjunctiva is not injected. No chemosis.    Left eye: Left conjunctiva is not injected. No chemosis.    Pupils: Pupils are equal, round, and reactive to light.   Cardiovascular:     Rate and Rhythm: Normal rate and regular rhythm.     Heart sounds: Normal heart sounds.  Pulmonary:     Effort: Pulmonary effort is normal. No tachypnea, accessory muscle usage or respiratory distress.     Breath sounds: Normal breath sounds. No wheezing, rhonchi or rales.  Chest:     Chest wall: No tenderness.  Abdominal:     Tenderness: There is no abdominal tenderness. There is no guarding or rebound.  Lymphadenopathy:     Head:     Right side of head: No submandibular, tonsillar or occipital adenopathy.     Left side of head: No submandibular, tonsillar or occipital adenopathy.     Cervical: No cervical adenopathy.  Skin:  General: Skin is warm.     Capillary Refill: Capillary refill takes less than 2 seconds.     Coloration: Skin is not pale.     Findings: No abrasion, erythema, petechiae or rash. Rash is not papular, urticarial or vesicular.  Neurological:     Mental Status: She is alert.  Psychiatric:        Behavior: Behavior is cooperative.      Diagnostic studies: labs sent instead       Malachi Bonds, MD  [facility]

## 2023-11-24 NOTE — Patient Instructions (Signed)
 Allergic rhinitis - Continue allergen avoidance measures directed toward cat and dog - Continue Claritin 10 mg once a day as needed for runny nose or itch - Consider saline nasal rinses as needed for nasal symptoms. Use this before any medicated nasal sprays for best result  2. Alpha gal allergy - We are going to recheck your levels.  - We can consider adding on Xolair for treatment of food allergies in the future. - Information on Xolair provided (we have used this drug for over 20 years for asthma, hives, and now food allergies).  - EpiPen renewed.   3. Hives - resolved  - Continue Claritin 10 mg one to two times daily. - You can change from one antihistamine to another every few months to help them maintain their effectiveness.  - Begin a journal of events that occurred for up to 6 hours before your symptoms began including foods and beverages consumed, soaps or perfumes you had contact with, and medications.  - One lab order has been placed to recheck your tryptase. We will call you when the result becomes available  4. Angioedema - resolved  - Previous workup was negative. - Keep track and take pictures of any further incidences of swelling - Continue triamcinolone 0.1% paste up to twice a day as needed for red, itchy areas  5. Dry mouth - Continue to hydrate and use the mouthwash from your dentist  6. Return in about 6 months (around 05/23/2024). You can have the follow up appointment with Dr. Dellis Anes or a Nurse Practicioner (our Nurse Practitioners are excellent and always have Physician oversight!).    Please inform us of any Emergency Department visits, hospitalizations, or changes in symptoms. Call us before going to the ED for breathing or allergy symptoms since we might be able to fit you in for a sick visit. Feel free to contact us anytime with any questions, problems, or concerns.  It was a pleasure to see you again today!  Websites that have reliable patient  information: 1. American Academy of Asthma, Allergy, and Immunology: www.aaaai.org 2. Food Allergy Research and Education (FARE): foodallergy.org 3. Mothers of Asthmatics: http://www.asthmacommunitynetwork.org 4. American College of Allergy, Asthma, and Immunology: www.acaai.org      "Like" [place] on Facebook and Instagram for our latest updates!      A healthy democracy works best when [employer] participate! Make sure you are registered to vote! If you have moved or changed any of your contact information, you will need to get this updated before voting! Scan the QR codes below to learn more!            Your blood pressure was elevated at today's visit.  Follow-up with your primary care provider for evaluation and management of your blood pressure as soon as possible

## 2023-11-27 LAB — ALPHA-GAL PANEL
Allergen Lamb IgE: 0.34 kU/L — AB
Beef IgE: 1.54 kU/L — AB
IgE (Immunoglobulin E), Serum: 141 [IU]/mL (ref 6–495)
O215-IgE Alpha-Gal: 6.22 kU/L — AB
Pork IgE: 0.27 kU/L — AB

## 2023-11-27 LAB — IGE: IgE (Immunoglobulin E), Serum: 134 [IU]/mL (ref 6–495)

## 2023-12-01 ENCOUNTER — Encounter: Payer: Self-pay | Admitting: Allergy & Immunology

## 2024-02-22 NOTE — Patient Instructions (Signed)
***   was able to tolerate the *** food challenge today at the office without adverse signs or symptoms of an allergic reaction. Therefore, *** has the same risk of systemic reaction associated with the consumption of *** as the general population.  - Do not give any ***  for the next 24 hours. - Monitor for allergic symptoms such as rash, wheezing, diarrhea, swelling, and vomiting for the next 24 hours. If severe symptoms occur, treat with EpiPen injection and call 911. For less severe symptoms treat with Benadryl *** teaspoonfuls every *** hours and call the clinic.  - If no allergic symptoms are evident, reintroduce ***  into the diet. If *** develops an allergic reaction to *** , record what was eaten the amount eaten, preparation method, time from ingestion to reaction, and symptoms.   Call the clinic if this treatment plan is not working well for you  Follow up in *** or sooner if needed.

## 2024-02-22 NOTE — Progress Notes (Signed)
   [address] [place] [address] Dept: [phone]  FOLLOW UP NOTE  Patient ID: Jackie Jackson, female    DOB: 1946/04/22  Age: 78 y.o. MRN: [id number] Date of Office Visit: 02/23/2024  Assessment  Chief Complaint: No chief complaint on file.  HPI Jackie Jackson is a 78 year old female who presents to the clinic [facility] follow-up visit with food challenge to mammalian meat.  She was last seen in this clinic on 03/23/2024 by Dr. Idolina Maker for evaluation of allergic rhinitis, alpha-gal allergy, hives, angioedema, and dry mouth.  Discussed the use of AI scribe software for clinical note transcription with the patient, who gave verbal consent to proceed.  History of Present Illness      Drug Allergies:  Allergies  Allergen Reactions   Pantoprazole Sodium Anaphylaxis and Swelling    Protonix-tongue swelling   Alpha-Gal    Amoxicillin Other (See Comments)    Sores and blisters in mouth   Statins     myalgia    Physical Exam: LMP 08/04/1997    Physical Exam  Diagnostics:   Procedure note:  Written consent obtained  {Blank single:19197::"Open graded *** challenge","Open graded *** oral challenge"}: The patient was able to tolerate the challenge today without adverse signs or symptoms. Vital signs were stable throughout the challenge and observation period. She received multiple doses separated by {Blank single:19197::"30 minutes","20 minutes","15 minutes","10 minutes"}, each of which was separated by vitals and a brief physical exam. She received the following doses: lip rub, 1 gm, 2 gm, 4 gm, 8 gm, and 16 gm. She was monitored for 60 minutes following the last dose.  Total testing time:  The patient had {Blank single:19197::"***","negative skin prick test and sIgE tests to ***","negative sIgE tests to ***","negative skin prick tests to ***"} and was able to tolerate the open graded oral challenge today without adverse signs or symptoms. Therefore, she has  the same risk of systemic reaction associated with {Blank single:19197::"***","the consumption of ***"} as the general population.   Assessment and Plan: No diagnosis found.  No orders of the defined types were placed in this encounter.   There are no Patient Instructions on file for this visit.  No follow-ups on file.    Thank you for the opportunity to care for this patient.  Please do not hesitate to contact me with questions.  Marinus Sic, FNP [facility]

## 2024-02-23 ENCOUNTER — Ambulatory Visit (INDEPENDENT_AMBULATORY_CARE_PROVIDER_SITE_OTHER): Admitting: Family Medicine

## 2024-02-23 ENCOUNTER — Encounter: Payer: Self-pay | Admitting: Family Medicine

## 2024-02-23 VITALS — BP 172/74 | HR 97 | Temp 98.7°F | Resp 16 | Wt 132.2 lb

## 2024-02-23 DIAGNOSIS — Z91018 Allergy to other foods: Secondary | ICD-10-CM

## 2024-04-04 ENCOUNTER — Other Ambulatory Visit: Payer: Self-pay | Admitting: Family Medicine

## 2024-04-04 DIAGNOSIS — Z1231 Encounter for screening mammogram for malignant neoplasm of breast: Secondary | ICD-10-CM

## 2024-05-02 ENCOUNTER — Ambulatory Visit
Admission: RE | Admit: 2024-05-02 | Discharge: 2024-05-02 | Disposition: A | Discharge status: Home / Self Care | Source: Ambulatory Visit | Attending: Family Medicine | Admitting: Family Medicine

## 2024-05-02 DIAGNOSIS — Z1231 Encounter for screening mammogram for malignant neoplasm of breast: Secondary | ICD-10-CM

## 2024-05-24 ENCOUNTER — Ambulatory Visit: Payer: Medicare PPO | Admitting: Allergy & Immunology

## 2024-06-26 ENCOUNTER — Ambulatory Visit (INDEPENDENT_AMBULATORY_CARE_PROVIDER_SITE_OTHER)

## 2024-06-26 ENCOUNTER — Ambulatory Visit (INDEPENDENT_AMBULATORY_CARE_PROVIDER_SITE_OTHER): Payer: Medicare PPO | Admitting: Family Medicine

## 2024-06-26 ENCOUNTER — Encounter: Payer: Self-pay | Admitting: Family Medicine

## 2024-06-26 VITALS — BP 127/84 | HR 84 | Temp 98.2°F | Ht 62.0 in | Wt 132.0 lb

## 2024-06-26 DIAGNOSIS — J301 Allergic rhinitis due to pollen: Secondary | ICD-10-CM | POA: Diagnosis not present

## 2024-06-26 DIAGNOSIS — Z1159 Encounter for screening for other viral diseases: Secondary | ICD-10-CM

## 2024-06-26 DIAGNOSIS — Z130 Encounter for screening for diseases of the blood and blood-forming organs and certain disorders involving the immune mechanism: Secondary | ICD-10-CM

## 2024-06-26 DIAGNOSIS — Z0001 Encounter for general adult medical examination with abnormal findings: Secondary | ICD-10-CM | POA: Diagnosis not present

## 2024-06-26 DIAGNOSIS — Z23 Encounter for immunization: Secondary | ICD-10-CM

## 2024-06-26 DIAGNOSIS — E785 Hyperlipidemia, unspecified: Secondary | ICD-10-CM

## 2024-06-26 DIAGNOSIS — E559 Vitamin D deficiency, unspecified: Secondary | ICD-10-CM

## 2024-06-26 DIAGNOSIS — Z780 Asymptomatic menopausal state: Secondary | ICD-10-CM

## 2024-06-26 DIAGNOSIS — Z13228 Encounter for screening for other metabolic disorders: Secondary | ICD-10-CM | POA: Diagnosis not present

## 2024-06-26 DIAGNOSIS — Z1329 Encounter for screening for other suspected endocrine disorder: Secondary | ICD-10-CM | POA: Diagnosis not present

## 2024-06-26 DIAGNOSIS — Z0000 Encounter for general adult medical examination without abnormal findings: Secondary | ICD-10-CM

## 2024-06-26 MED ORDER — AZELASTINE HCL 0.1 % NA SOLN: 1.0000 | Freq: Two times a day (BID) | NASAL | 12 refills | Status: AC

## 2024-06-26 NOTE — Patient Instructions (Signed)

## 2024-06-26 NOTE — Progress Notes (Signed)
 Complete physical exam  Patient: Jackie Jackson   DOB: [birth year]   78 y.o. Female  MRN: [id number]  Subjective:    Chief Complaint  Patient presents with   Annual Exam    Jackie Jackson is a 78 y.o. female who presents today for a complete physical exam. She reports consuming a general diet. Gym/ health club routine includes cardio, light weights, and low impact aerobics. She generally feels well. She reports sleeping fairly well. She does have additional problems to discuss today.   Upper respiratory symptoms - Sinus drainage, particularly severe in the mornings - History of allergies, previously under care of an allergist with recent completion of treatment - Currently taking Claritin  for symptom management - Flonase  nasal spray previously trialed but caused nausea, discontinued - No other nasal sprays have been tried   Most recent fall risk assessment:    09/09/2023   10:11 AM  Fall Risk   Falls in the past year? 0  Number falls in past yr: 0  Injury with Fall? 0  Risk for fall due to : No Fall Risks  Follow up Falls prevention discussed;Education provided;Falls evaluation completed     Most recent depression screenings:    09/09/2023   10:10 AM 06/24/2023    2:16 PM  PHQ 2/9 Scores  PHQ - 2 Score 0 0  PHQ- 9 Score  0    Vision:Within last year and Dental: No current dental problems and Receives regular dental care  Past Medical History:  Diagnosis Date   Acid reflux    Allergy    seasonal   Angio-edema    High cholesterol    Hypertension    Osteopenia    2006 -1.4, 2009 -1.9, 2011 -1.9      Patient Care Team: Jackie Annabella HERO, FNP as PCP - General (Family Medicine) Jackie Inocente PARAS, NP (Inactive) as Nurse Practitioner (Obstetrics and Gynecology) Jackie Jackson, OD (Optometry) [facility]   Outpatient Medications Prior to Visit  Medication Sig   acetaminophen (TYLENOL) 500 MG tablet Take 500 mg by mouth 2 (two) times  daily as needed for mild pain or headache.    Calcium  Carb-Cholecalciferol (CALCIUM  1000 + D PO) Take 1 tablet by mouth daily.   EPINEPHrine  (EPIPEN  2-PAK) 0.3 mg/0.3 mL IJ SOAJ injection Inject 0.3 mg into the muscle as needed for anaphylaxis.   ezetimibe  (ZETIA ) 10 MG tablet Take 1 tablet (10 mg total) by mouth daily.   fluticasone  (FLONASE ) 50 MCG/ACT nasal spray Place 2 sprays into both nostrils daily.   loratadine  (CLARITIN ) 10 MG tablet Take 1 tablet (10 mg total) by mouth daily. TAKE ONE (1) TABLET EACH DAY Strength: 10 mg   Multiple Vitamin (MULTIVITAMIN WITH MINERALS) TABS tablet Take 1 tablet by mouth daily.   Omega-3 Fatty Acids (FISH OIL) 1000 MG CAPS Take by mouth.   Facility-Administered Medications Prior to Visit  Medication Dose Route Frequency Provider   0.9 %  sodium chloride  infusion  500 mL Intravenous Continuous Jackie Rosario BROCKS, MD    ROS Negative unless specially indicated above in HPI.     Objective:     BP 127/84 Comment: at home reading per pt  Pulse 84   Temp 98.2 F (36.8 C) (Temporal)   Ht 5' 2 (1.575 m)   Wt 132 lb (59.9 kg)   LMP 08/04/1997   SpO2 99%   BMI 24.14 kg/m    Physical Exam Vitals and nursing note reviewed.  Constitutional:      General: She is not in acute distress.    Appearance: Normal appearance. She is not ill-appearing.  HENT:     Head: Normocephalic.     Right Ear: Tympanic membrane, ear canal and external ear normal.     Left Ear: Tympanic membrane, ear canal and external ear normal.     Nose: Nose normal.     Mouth/Throat:     Mouth: Mucous membranes are moist.     Pharynx: Oropharynx is clear.  Eyes:     Extraocular Movements: Extraocular movements intact.     Conjunctiva/sclera: Conjunctivae normal.     Pupils: Pupils are equal, round, and reactive to light.  Neck:     Thyroid : No thyroid  mass, thyromegaly or thyroid  tenderness.  Cardiovascular:     Rate and Rhythm: Normal rate and regular rhythm.     Pulses:  Normal pulses.     Heart sounds: Normal heart sounds. No murmur heard.    No friction rub. No gallop.  Pulmonary:     Effort: Pulmonary effort is normal.     Breath sounds: Normal breath sounds.  Abdominal:     General: Bowel sounds are normal. There is no distension.     Palpations: Abdomen is soft. There is no mass.     Tenderness: There is no abdominal tenderness. There is no guarding.  Musculoskeletal:        General: No tenderness.     Cervical back: Normal range of motion and neck supple. No tenderness.     Right lower leg: No edema.     Left lower leg: No edema.  Skin:    General: Skin is warm and dry.     Capillary Refill: Capillary refill takes less than 2 seconds.     Findings: No lesion or rash.  Neurological:     General: No focal deficit present.     Mental Status: She is alert and oriented to person, place, and time.     Cranial Nerves: No cranial nerve deficit.     Motor: No weakness.     Gait: Gait normal.  Psychiatric:        Mood and Affect: Mood normal.        Behavior: Behavior normal.        Thought Content: Thought content normal.        Judgment: Judgment normal.      No results found for any visits on 06/26/24.     Assessment & Plan:    Routine Health Maintenance and Physical Exam Jackie Jackson was seen today for annual exam.  Diagnoses and all orders for this visit:  Routine general medical examination at a health care facility  Vitamin D  deficiency -     VITAMIN D  25 Hydroxy (Vit-D Deficiency, Fractures)  Hyperlipidemia, unspecified hyperlipidemia type -     Lipid panel  Need for hepatitis C screening test -     HCV Ab w Reflex to Quant PCR  Screening for endocrine, metabolic and immunity disorder -     CBC with Differential/Platelet -     CMP14+EGFR -     TSH  Postmenopausal -     DG [facility] DEXA  Chronic seasonal allergic rhinitis due to pollen -     azelastine (ASTELIN) 0.1 % nasal spray; Place 1 spray into both nostrils 2  (two) times daily. Use in each nostril as directed  Need for immunization against influenza   Allergic rhinitis with postnasal drip  Chronic allergic rhinitis with significant postnasal drip, especially in the mornings. Current treatment includes Claritin  and Flonase , though Flonase  causes nausea. Previous allergist consultation deemed no further intervention necessary. Open to trying a different nasal spray. - Prescribed Astelin nasal spray, one spray twice daily in each nostril. - Continue Claritin .  Flu vaccine today in office.   Immunization History  Administered Date(s) Administered   Fluad Quad(high Dose 65+) 08/11/2021, 07/29/2022   INFLUENZA, HIGH DOSE SEASONAL PF 08/26/2016, 10/29/2017, 08/10/2018, 08/17/2019   Influenza-Unspecified 08/17/2019, 08/21/2020, 07/29/2023   Moderna Covid-19 Vaccine Bivalent Booster 54yrs & up 08/12/2021   Moderna Sars-Covid-2 Vaccination 11/02/2019, 12/01/2019, 08/21/2020   PNEUMOCOCCAL CONJUGATE-20 09/23/2021   Pneumococcal Conjugate-13 10/29/2017   Zoster Recombinant(Shingrix) 11/17/2021, 02/05/2022    Health Maintenance  Topic Date Due   Hepatitis C Screening  Never done   DEXA SCAN  09/25/2023   Influenza Vaccine  04/28/2024   DTaP/Tdap/Td (1 - Tdap) 06/26/2025 (Originally 03/06/1965)   COVID-19 Vaccine (5 - 2025-26 season) 07/12/2025 (Originally 05/29/2024)   Medicare Annual Wellness (AWV)  09/08/2024   Colonoscopy  04/05/2026   Pneumococcal Vaccine: 50+ Years  Completed   Zoster Vaccines- Shingrix  Completed   HPV VACCINES  Aged Out   Meningococcal B Vaccine  Aged Out   Mammogram  Discontinued    Discussed health benefits of physical activity, and encouraged her to engage in regular exercise appropriate for her age and condition.  Problem List Items Addressed This Visit       Respiratory   Chronic seasonal allergic rhinitis due to pollen   Relevant Medications   azelastine (ASTELIN) 0.1 % nasal spray     Other   Vitamin D   deficiency   Relevant Orders   VITAMIN D  25 Hydroxy (Vit-D Deficiency, Fractures)   Other Visit Diagnoses       Routine general medical examination at a health care facility    -  Primary     Hyperlipidemia, unspecified hyperlipidemia type       Relevant Orders   Lipid panel     Need for hepatitis C screening test       Relevant Orders   HCV Ab w Reflex to Quant PCR     Screening for endocrine, metabolic and immunity disorder       Relevant Orders   CBC with Differential/Platelet   CMP14+EGFR   TSH     Postmenopausal       Relevant Orders   DG [facility] DEXA     Need for immunization against influenza          Return in 1 year (on 06/26/2025).   The patient indicates understanding of these issues and agrees with the plan.  Annabella CHRISTELLA Search, FNP

## 2024-06-27 LAB — CBC WITH DIFFERENTIAL/PLATELET
Basophils Absolute: 0.1 x10E3/uL (ref 0.0–0.2)
Basos: 1 %
EOS (ABSOLUTE): 0.1 x10E3/uL (ref 0.0–0.4)
Eos: 1 %
Hematocrit: 38.3 % (ref 34.0–46.6)
Hemoglobin: 12.2 g/dL (ref 11.1–15.9)
Immature Grans (Abs): 0 x10E3/uL (ref 0.0–0.1)
Immature Granulocytes: 0 %
Lymphocytes Absolute: 2 x10E3/uL (ref 0.7–3.1)
Lymphs: 33 %
MCH: 30.2 pg (ref 26.6–33.0)
MCHC: 31.9 g/dL (ref 31.5–35.7)
MCV: 95 fL (ref 79–97)
Monocytes Absolute: 0.4 x10E3/uL (ref 0.1–0.9)
Monocytes: 7 %
Neutrophils Absolute: 3.5 x10E3/uL (ref 1.4–7.0)
Neutrophils: 58 %
Platelets: 293 x10E3/uL (ref 150–450)
RBC: 4.04 x10E6/uL (ref 3.77–5.28)
RDW: 12.5 % (ref 11.7–15.4)
WBC: 6 x10E3/uL (ref 3.4–10.8)

## 2024-06-27 LAB — VITAMIN D 25 HYDROXY (VIT D DEFICIENCY, FRACTURES): Vit D, 25-Hydroxy: 46.2 ng/mL (ref 30.0–100.0)

## 2024-06-27 LAB — CMP14+EGFR
ALT: 20 IU/L (ref 0–32)
AST: 19 IU/L (ref 0–40)
Albumin: 4.5 g/dL (ref 3.8–4.8)
Alkaline Phosphatase: 87 IU/L (ref 49–135)
BUN/Creatinine Ratio: 17 (ref 12–28)
BUN: 16 mg/dL (ref 8–27)
Bilirubin Total: 0.2 mg/dL (ref 0.0–1.2)
CO2: 23 mmol/L (ref 20–29)
Calcium: 9.9 mg/dL (ref 8.7–10.3)
Chloride: 101 mmol/L (ref 96–106)
Creatinine, Ser: 0.96 mg/dL (ref 0.57–1.00)
Globulin, Total: 2 g/dL (ref 1.5–4.5)
Glucose: 68 mg/dL — ABNORMAL LOW (ref 70–99)
Potassium: 4.6 mmol/L (ref 3.5–5.2)
Sodium: 140 mmol/L (ref 134–144)
Total Protein: 6.5 g/dL (ref 6.0–8.5)
eGFR: 61 mL/min/1.73 (ref >59)

## 2024-06-27 LAB — TSH: TSH: 2.26 u[IU]/mL (ref 0.450–4.500)

## 2024-06-27 LAB — LIPID PANEL
Chol/HDL Ratio: 3.2 ratio (ref 0.0–4.4)
Cholesterol, Total: 240 mg/dL — ABNORMAL HIGH (ref 100–199)
HDL: 75 mg/dL (ref >39)
LDL Chol Calc (NIH): 151 mg/dL — ABNORMAL HIGH (ref 0–99)
Triglycerides: 81 mg/dL (ref 0–149)
VLDL Cholesterol Cal: 14 mg/dL (ref 5–40)

## 2024-06-27 LAB — HCV INTERPRETATION

## 2024-06-27 LAB — HCV AB W REFLEX TO QUANT PCR: HCV Ab: NONREACTIVE

## 2024-06-28 ENCOUNTER — Ambulatory Visit: Payer: Self-pay | Admitting: Family Medicine

## 2024-06-28 DIAGNOSIS — M8580 Other specified disorders of bone density and structure, unspecified site: Secondary | ICD-10-CM | POA: Diagnosis not present

## 2024-06-28 DIAGNOSIS — Z780 Asymptomatic menopausal state: Secondary | ICD-10-CM | POA: Diagnosis not present

## 2024-06-28 DIAGNOSIS — E782 Mixed hyperlipidemia: Secondary | ICD-10-CM | POA: Insufficient documentation

## 2024-06-28 DIAGNOSIS — Z789 Other specified health status: Secondary | ICD-10-CM

## 2024-06-28 MED ORDER — NEXLIZET 180-10 MG PO TABS: 1.0000 | ORAL_TABLET | Freq: Every day | ORAL | 3 refills | Status: DC

## 2024-06-28 NOTE — Telephone Encounter (Signed)
 Patient returned missed call from me. I made her aware of results/PCP advise. Patient is agreeable to trying Nexlizet Rx if insurance will cover.

## 2024-06-28 NOTE — Telephone Encounter (Signed)
 Per CRM, E2C2 Rep reviewed results with patient but patient had additional questions.  Tried calling patient to go over questions but no answer. Left message for patient to call back.

## 2024-07-06 MED ORDER — EZETIMIBE 10 MG PO TABS: 10.0000 mg | ORAL_TABLET | Freq: Every day | ORAL | 3 refills | Status: AC

## 2024-07-07 ENCOUNTER — Other Ambulatory Visit: Payer: Self-pay | Admitting: Family Medicine

## 2024-07-07 ENCOUNTER — Telehealth: Payer: Self-pay

## 2024-07-07 DIAGNOSIS — M8580 Other specified disorders of bone density and structure, unspecified site: Secondary | ICD-10-CM

## 2024-07-07 DIAGNOSIS — Z789 Other specified health status: Secondary | ICD-10-CM

## 2024-07-07 DIAGNOSIS — E782 Mixed hyperlipidemia: Secondary | ICD-10-CM

## 2024-07-07 NOTE — Progress Notes (Signed)
 Care Guide Pharmacy Note  07/07/2024 Name: Jackie Jackson MRN: [id number] DOB: 04/19/1946  Referred By: Joesph Annabella HERO, FNP Reason for referral: Complex Care Management (Outreach to schedule with Pharm d )   Jackie Jackson is a 78 y.o. year old female who is a primary care patient of Joesph Annabella HERO, FNP.  Jackie Jackson was referred to the pharmacist for assistance related to: HLD and osteoporosis   An unsuccessful telephone outreach was attempted today to contact the patient who was referred to the pharmacy team for assistance with medication management. Additional attempts will be made to contact the patient.  Jeoffrey Buffalo , RMA     [facility]  [facility], [facility] Guide  Direct Dial: [phone]  Website: [email or web address]

## 2024-07-10 ENCOUNTER — Telehealth: Payer: Self-pay

## 2024-07-10 NOTE — Progress Notes (Unsigned)
 Care Guide Pharmacy Note  07/10/2024 Name: Kathya Wilz Kent MRN: [id number] DOB: [birth year]  Referred By: Joesph Annabella HERO, FNP Reason for referral: Complex Care Management (Outreach to schedule with Pharm d )   Raymona Boss Ciccarelli is a 78 y.o. year old female who is a primary care patient of Joesph Annabella HERO, FNP.  Rollene FALCON Borkenhagen was referred to the pharmacist for assistance related to: osteoporosis   A second unsuccessful telephone outreach was attempted today to contact the patient who was referred to the pharmacy team for assistance with medication assistance. Additional attempts will be made to contact the patient.  Jeoffrey Buffalo , RMA     [facility]  [facility], [facility] Guide  Direct Dial: [phone]  Website: [email or web address]

## 2024-07-11 NOTE — Progress Notes (Signed)
 Care Guide Pharmacy Note  07/11/2024 Name: Jackie Jackson MRN: [id number] DOB: [birth year]  Referred By: Joesph Annabella HERO, FNP Reason for referral: Complex Care Management (Outreach to schedule with Pharm d )   Jackie Jackson is a 78 y.o. year old female who is a primary care patient of Joesph Annabella HERO, FNP.  Jackie Jackson was referred to the pharmacist for assistance related to: HLD and osteoporosis  Successful contact was made with the patient to discuss pharmacy services including being ready for the pharmacist to call at least 5 minutes before the scheduled appointment time and to have medication bottles and any blood pressure readings ready for review. The patient agreed to meet with the pharmacist via face to face  on (date/time).08/01/2024  Jeoffrey Buffalo , RMA     [place]  [facility], [facility] Guide  Direct Dial: [phone]  Website: [email or web address]

## 2024-08-01 ENCOUNTER — Ambulatory Visit: Payer: Self-pay

## 2024-08-01 DIAGNOSIS — E782 Mixed hyperlipidemia: Secondary | ICD-10-CM | POA: Diagnosis not present

## 2024-08-01 NOTE — Progress Notes (Signed)
 08/01/2024 Name: Jackie Jackson MRN: [id number] DOB: [birth year]  Chief Complaint  Patient presents with   Hyperlipidemia   Osteopenia    Jackie Jackson Jackie Jackson is a 78 y.o. year old female who was referred for medication management by their primary care provider, Jackie Annabella HERO, FNP. They presented for a face to face visit today.   They were referred to the pharmacist by their PCP for assistance in managing hyperlipidemia/cardiovascular risk reduction and osteopenia    Subjective:  Care Team: Primary Care Provider: Joesph Annabella HERO, FNP    Medication Access/Adherence  Current Pharmacy:  [facility] [place] [place] - [address] [place] [address] Phone: [phone] Fax: [phone]   Patient reports affordability concerns with their medications: No  Patient reports access/transportation concerns to their pharmacy: No  Patient reports adherence concerns with their medications:  No     Hyperlipidemia/ASCVD Risk Reduction  Current lipid lowering medications: Zetia , fish oil OTC  Medications tried in the past: n/a  Antiplatelet regimen: n/a  ASCVD History: n/a Family History: unsure, but stroke & MI history in the family Risk Factors: elevated LDL  Current physical activity: gym 3-4 weekly  Current medication access support: n/a   Osteopenia:  Current medications: n/a Medications tried in the past: n/a  Current supplements: will start calcium  and vitamin D  (encouraged from diet first, then supplement as needed)  Current physical activity: gym now 3-4 weekly  Most recent DEXA:  06/28/2024 Exclusions: L3 due to degenerative changes.   COMPARISON:  09/24/2021.   FINDINGS: Scan quality: Good.   LUMBAR SPINE (L1-L2, L4):  BMD (in g/cm2): 0.935  T-score: -2.0  Z-score: 0.0   Rate of change from previous exam: No significant rate of change from previous exam.   LEFT FEMORAL NECK:  BMD (in g/cm2): 0.777  T-score: -1.9   Z-score: 0.3   LEFT TOTAL HIP:  BMD (in g/cm2): 0.842  T-score: -1.3  Z-score: 0.7   RIGHT FEMORAL NECK:  BMD (in g/cm2): 0.759   T-score: -2.0  Z-score: 0.2   RIGHT TOTAL HIP:  BMD (in g/cm2): 0.796  T-score: -1.7  Z-score: 0.3   DUAL-FEMUR TOTAL MEAN:   Rate of change from previous exam: No significant rate of change from previous exam.   FRAX 10-YEAR PROBABILITY OF FRACTURE:   10-year fracture risk is performed using the [facility] FRAX calculator based on patient-reported risk factors.   Major osteoporotic fracture: 15.0% Hip fracture: 4.4%  Objective:  Lab Results  Component Value Date   CREATININE 0.96 06/26/2024   BUN 16 06/26/2024   NA 140 06/26/2024   K 4.6 06/26/2024   CL 101 06/26/2024   CO2 23 06/26/2024    Lab Results  Component Value Date   CHOL 240 (H) 06/26/2024   HDL 75 06/26/2024   LDLCALC 151 (H) 06/26/2024   TRIG 81 06/26/2024   CHOLHDL 3.2 06/26/2024    Medications Reviewed Today     Reviewed by Jackie Jackson, [facility] (Pharmacist) on 08/07/24 at 1147  Med List Status: <None>   Medication Order Taking? Sig Documenting Provider Last Dose Status Informant  0.9 %  sodium chloride  infusion [id number]   Jackson, Jackie C, MD  Active   acetaminophen (TYLENOL) 500 MG tablet [id number]  Take 500 mg by mouth 2 (two) times daily as needed for mild pain or headache.  [provider]  Active Self  azelastine (ASTELIN) 0.1 % nasal spray [id number]  Place  1 spray into both nostrils 2 (two) times daily. Use in each nostril as directed Jackie Annabella HERO, FNP  Active   Calcium  Carb-Cholecalciferol (CALCIUM  1000 + D PO) [id number]  Take 1 tablet by mouth daily. [provider]  Active Self  EPINEPHrine  (EPIPEN  2-PAK) 0.3 mg/0.3 mL IJ SOAJ injection [id number]  Inject 0.3 mg into the muscle as needed for anaphylaxis. Jackie Marty Saltness, MD  Active   ezetimibe  (ZETIA ) 10 MG tablet [id number]  Take 1 tablet (10 mg total) by mouth  daily. Jackie Annabella HERO, FNP  Active   fluticasone  (FLONASE ) 50 MCG/ACT nasal spray [id number]  Place 2 sprays into both nostrils daily. Jackie Annabella HERO, FNP  Active   loratadine  (CLARITIN ) 10 MG tablet [id number]  Take 1 tablet (10 mg total) by mouth daily. TAKE ONE (1) TABLET EACH DAY Strength: 10 mg Jackson, Jackie M, FNP  Active   Multiple Vitamin (MULTIVITAMIN WITH MINERALS) TABS tablet [id number]  Take 1 tablet by mouth daily. [provider]  Active   Omega-3 Fatty Acids (FISH OIL) 1000 MG CAPS [id number]  Take by mouth. [provider]  Active               Assessment/Plan:   Hyperlipidemia/ASCVD Risk Reduction: - Currently uncontrolled. --patient would like to continue Zetia  and diet/lifestyle; discussed benefits as risks of this.  Will recheck lipids 6 months from last lipid panel; likely start Nexletol or Repatha at follow up if still not controlled; discussed labs in detail; Continue Zetia  for now - Reviewed long term complications of uncontrolled cholesterol - Reviewed dietary recommendations including .FOLLOWING A HEART HEALTHY DIET/HEALTHY PLATE METHOD  Dietary and lifestyle educational handouts given/discussed with patient   Osteopenia: - Currently appropriately managed--osteopenia for now; patient prefers diet/lifestyle  - Reviewed recommendation for daily calcium  intake of 1200 mg and vitamin D  intake of [phone] units - Recommended to choose calcium  citrate formulation due to concurrent acid reflux medication - Reviewed benefits of weight bearing exercise  Patient is now going to the gym 3-4 weekly - Recommend to start calcium  and vitamin D  as above -Dexa in 2 yrs -educational hand outs provided    Follow Up Plan: 6 months PCP/PHARMD  Jackie Jackson, PharmD, BCACP, CPP Clinical Pharmacist, [facility]

## 2024-09-11 ENCOUNTER — Ambulatory Visit: Payer: Medicare PPO

## 2024-09-11 VITALS — BP 127/84 | HR 84 | Ht 62.0 in | Wt 132.0 lb

## 2024-09-11 DIAGNOSIS — Z0000 Encounter for general adult medical examination without abnormal findings: Secondary | ICD-10-CM

## 2024-09-11 NOTE — Progress Notes (Signed)
 Chief Complaint  Patient presents with   Medicare Wellness     Subjective:   Jackie Jackson is a 78 y.o. female who presents for a Medicare Annual Wellness Visit.  Visit info / Clinical Intake: Medicare Wellness Visit Type:: Subsequent Annual Wellness Visit Persons participating in visit and providing information:: patient Medicare Wellness Visit Mode:: Telephone If telephone:: video declined Since this visit was completed virtually, some vitals may be partially provided or unavailable. Missing vitals are due to the limitations of the virtual format.: Documented vitals are patient reported If Telephone or Video please confirm:: I connected with patient using audio/video enable telemedicine. I verified patient identity with two identifiers, discussed telehealth limitations, and patient agreed to proceed. Patient Location:: home Provider Location:: office Interpreter Needed?: No Pre-visit prep was completed: yes AWV questionnaire completed by patient prior to visit?: no Living arrangements:: lives with spouse/significant other Patient's Overall Health Status Rating: very good Typical amount of pain: none Does pain affect daily life?: no Are you currently prescribed opioids?: no  Dietary Habits and Nutritional Risks How many meals a day?: 2 Eats fruit and vegetables daily?: yes Most meals are obtained by: preparing own meals Diabetic:: no  Functional Status Activities of Daily Living (to include ambulation/medication): Independent Ambulation: Independent Medication Administration: Independent Home Management (perform basic housework or laundry): Independent Manage your own finances?: yes Primary transportation is: driving Concerns about vision?: no *vision screening is required for WTM* (last ov 2024-not sure. [facility] in [place], [place]/Dr. Vicci) Concerns about hearing?: no  Fall Screening Falls in the past year?: 0 Number of falls in past year: 0 Was there an injury  with Fall?: 0 Fall Risk Category Calculator: 0 Patient Fall Risk Level: Low Fall Risk  Fall Risk Patient at Risk for Falls Due to: No Fall Risks Fall risk Follow up: Falls evaluation completed; Education provided  Home and Transportation Safety: All rugs have non-skid backing?: yes All stairs or steps have railings?: yes Grab bars in the bathtub or shower?: yes Have non-skid surface in bathtub or shower?: yes Good home lighting?: yes Regular seat belt use?: yes Hospital stays in the last year:: no  Cognitive Assessment Difficulty concentrating, remembering, or making decisions? : no Will 6CIT or Mini Cog be Completed: yes What year is it?: 0 points What month is it?: 0 points Give patient an address phrase to remember (5 components): [address] [place] About what time is it?: 0 points Count backwards from 20 to 1: 0 points Say the months of the year in reverse: 0 points Repeat the address phrase from earlier: 0 points 6 CIT Score: 0 points  Advance Directives (For Healthcare) Does Patient Have a Medical Advance Directive?: No Would patient like information on creating a medical advance directive?: No - Patient declined  Reviewed/Updated  Reviewed/Updated: Reviewed All (Medical, Surgical, Family, Medications, Allergies, Care Teams, Patient Goals); Medical History; Surgical History; Family History; Medications; Allergies; Care Teams; Patient Goals    Allergies (verified) Pantoprazole sodium, Alpha-gal, Amoxicillin, and Statins   Current Medications (verified) Outpatient Encounter Medications as of 09/11/2024  Medication Sig   acetaminophen (TYLENOL) 500 MG tablet Take 500 mg by mouth 2 (two) times daily as needed for mild pain or headache.    azelastine  (ASTELIN ) 0.1 % nasal spray Place 1 spray into both nostrils 2 (two) times daily. Use in each nostril as directed   Calcium  Carb-Cholecalciferol (CALCIUM  1000 + D PO) Take 1 tablet by mouth daily.    EPINEPHrine  (EPIPEN   2-PAK) 0.3 mg/0.3 mL IJ SOAJ injection Inject 0.3 mg into the muscle as needed for anaphylaxis.   ezetimibe  (ZETIA ) 10 MG tablet Take 1 tablet (10 mg total) by mouth daily.   fluticasone  (FLONASE ) 50 MCG/ACT nasal spray Place 2 sprays into both nostrils daily.   loratadine  (CLARITIN ) 10 MG tablet Take 1 tablet (10 mg total) by mouth daily. TAKE ONE (1) TABLET EACH DAY Strength: 10 mg   Multiple Vitamin (MULTIVITAMIN WITH MINERALS) TABS tablet Take 1 tablet by mouth daily.   Omega-3 Fatty Acids (FISH OIL) 1000 MG CAPS Take by mouth.   Facility-Administered Encounter Medications as of 09/11/2024  Medication   0.9 %  sodium chloride  infusion    History: Past Medical History:  Diagnosis Date   Acid reflux    Allergy    seasonal   Angio-edema    High cholesterol    Hypertension    Osteopenia    2006 -1.4, 2009 -1.9, 2011 -1.9   Past Surgical History:  Procedure Laterality Date   BREAST BIOPSY Left 2012   CATARACT EXTRACTION W/PHACO Left 03/22/2017   Procedure: CATARACT EXTRACTION PHACO AND INTRAOCULAR LENS PLACEMENT (IOC);  Surgeon: Perley Hamilton, MD;  Location: [facility];  Service: Ophthalmology;  Laterality: Left;  CDE:  8.65   CATARACT EXTRACTION W/PHACO Right 04/05/2017   Procedure: CATARACT EXTRACTION PHACO AND INTRAOCULAR LENS PLACEMENT (IOC);  Surgeon: Perley Hamilton, MD;  Location: [facility];  Service: Ophthalmology;  Laterality: Right;  CDE:  5.23    SKIN CANCER DESTRUCTION  2012   rt. lower leg   TUBAL LIGATION     Family History  Problem Relation Age of Onset   Heart disease Mother    Heart disease Father    Lung cancer Sister    Diabetes Brother    Heart disease Maternal Grandmother    Healthy Daughter    Healthy Son    Colon cancer Neg Hx    Stomach cancer Neg Hx    Esophageal cancer Neg Hx    Social History   Occupational History   Occupation: retired     Comment: solicitor of court - superior x 30 yrs   Tobacco Use   Smoking status: Never    Smokeless tobacco: Never  Vaping Use   Vaping status: Never Used  Substance and Sexual Activity   Alcohol use: No   Drug use: No   Sexual activity: Not Currently   Tobacco Counseling Counseling given: Yes  SDOH Screenings   Food Insecurity: No Food Insecurity (09/11/2024)  Housing: Unknown (09/11/2024)  Transportation Needs: No Transportation Needs (09/11/2024)  Utilities: Not At Risk (09/11/2024)  Alcohol Screen: Low Risk (09/09/2023)  Depression (PHQ2-9): Low Risk (09/11/2024)  Financial Resource Strain: Low Risk (09/09/2023)  Physical Activity: Sufficiently Active (09/11/2024)  Social Connections: Socially Integrated (09/11/2024)  Stress: No Stress Concern Present (09/11/2024)  Tobacco Use: Low Risk (09/11/2024)  Health Literacy: Patient Unable To Answer (09/11/2024)   See flowsheets for full screening details  Depression Screen PHQ 2 & 9 Depression Scale- Over the past 2 weeks, how often have you been bothered by any of the following problems? Little interest or pleasure in doing things: 0 Feeling down, depressed, or hopeless (PHQ Adolescent also includes...irritable): 0 PHQ-2 Total Score: 0     Goals Addressed             This Visit's Progress    Prevent falls   On track    Stay active  Objective:    Today's Vitals   09/11/24 1304  BP: 127/84  Pulse: 84  Weight: 132 lb (59.9 kg)  Height: 5' 2 (1.575 m)   Body mass index is 24.14 kg/m.  Hearing/Vision screen No results found. Immunizations and Health Maintenance Health Maintenance  Topic Date Due   DTaP/Tdap/Td (1 - Tdap) 06/26/2025 (Originally 03/06/1965)   COVID-19 Vaccine (5 - 2025-26 season) 07/12/2025 (Originally 05/29/2024)   Medicare Annual Wellness (AWV)  09/11/2025   Colonoscopy  04/05/2026   Bone Density Scan  06/28/2026   Pneumococcal Vaccine: 50+ Years  Completed   Influenza Vaccine  Completed   Hepatitis C Screening  Completed   Zoster Vaccines- Shingrix  Completed    Meningococcal B Vaccine  Aged Out   Mammogram  Discontinued        Assessment/Plan:  This is a routine wellness examination for [facility].  Patient Care Team: Joesph Annabella HERO, FNP as PCP - General (Family Medicine) Neysa Inocente PARAS, NP (Inactive) as Nurse Practitioner (Obstetrics and Gynecology) Gladis Gearing, OD (Optometry) [facility]  I have personally reviewed and noted the following in the patients chart:   Medical and social history Use of alcohol, tobacco or illicit drugs  Current medications and supplements including opioid prescriptions. Functional ability and status Nutritional status Physical activity Advanced directives List of other physicians Hospitalizations, surgeries, and ER visits in previous 12 months Vitals Screenings to include cognitive, depression, and falls Referrals and appointments  No orders of the defined types were placed in this encounter.  In addition, I have reviewed and discussed with patient certain preventive protocols, quality metrics, and best practice recommendations. A written personalized care plan for preventive services as well as general preventive health recommendations were provided to patient.   Ozie Ned, CMA   09/11/2024   Return in 1 year (on 09/11/2025).  After Visit Summary: (MyChart) Due to this being a telephonic visit, the after visit summary with patients personalized plan was offered to patient via MyChart   Nurse Notes: n/a   Chief Complaint  Patient presents with   Medicare Wellness     Subjective:   Jackie Jackson is a 78 y.o. female who presents for a Medicare Annual Wellness Visit.  Visit info / Clinical Intake: Medicare Wellness Visit Type:: Subsequent Annual Wellness Visit Persons participating in visit and providing information:: patient Medicare Wellness Visit Mode:: Telephone If telephone:: video declined Since this visit was completed virtually, some vitals  may be partially provided or unavailable. Missing vitals are due to the limitations of the virtual format.: Documented vitals are patient reported If Telephone or Video please confirm:: I connected with patient using audio/video enable telemedicine. I verified patient identity with two identifiers, discussed telehealth limitations, and patient agreed to proceed. Patient Location:: home Provider Location:: office Interpreter Needed?: No Pre-visit prep was completed: yes AWV questionnaire completed by patient prior to visit?: no Living arrangements:: lives with spouse/significant other Patient's Overall Health Status Rating: very good Typical amount of pain: none Does pain affect daily life?: no Are you currently prescribed opioids?: no  Dietary Habits and Nutritional Risks How many meals a day?: 2 Eats fruit and vegetables daily?: yes Most meals are obtained by: preparing own meals Diabetic:: no  Functional Status Activities of Daily Living (to include ambulation/medication): Independent Ambulation: Independent Medication Administration: Independent Home Management (perform basic housework or laundry): Independent Manage your own finances?: yes Primary transportation is: driving Concerns about vision?: no *vision screening is required  for WTM* (last ov 2024-not sure. [facility] in [place], [place]/Dr. Vicci) Concerns about hearing?: no  Fall Screening Falls in the past year?: 0 Number of falls in past year: 0 Was there an injury with Fall?: 0 Fall Risk Category Calculator: 0 Patient Fall Risk Level: Low Fall Risk  Fall Risk Patient at Risk for Falls Due to: No Fall Risks Fall risk Follow up: Falls evaluation completed; Education provided  Home and Transportation Safety: All rugs have non-skid backing?: yes All stairs or steps have railings?: yes Grab bars in the bathtub or shower?: yes Have non-skid surface in bathtub or shower?: yes Good home lighting?: yes Regular seat belt  use?: yes Hospital stays in the last year:: no  Cognitive Assessment Difficulty concentrating, remembering, or making decisions? : no Will 6CIT or Mini Cog be Completed: yes What year is it?: 0 points What month is it?: 0 points Give patient an address phrase to remember (5 components): [address] [place] About what time is it?: 0 points Count backwards from 20 to 1: 0 points Say the months of the year in reverse: 0 points Repeat the address phrase from earlier: 0 points 6 CIT Score: 0 points  Advance Directives (For Healthcare) Does Patient Have a Medical Advance Directive?: No Would patient like information on creating a medical advance directive?: No - Patient declined  Reviewed/Updated  Reviewed/Updated: Reviewed All (Medical, Surgical, Family, Medications, Allergies, Care Teams, Patient Goals); Medical History; Surgical History; Family History; Medications; Allergies; Care Teams; Patient Goals    Allergies (verified) Pantoprazole sodium, Alpha-gal, Amoxicillin, and Statins   Current Medications (verified) Outpatient Encounter Medications as of 09/11/2024  Medication Sig   acetaminophen (TYLENOL) 500 MG tablet Take 500 mg by mouth 2 (two) times daily as needed for mild pain or headache.    azelastine  (ASTELIN ) 0.1 % nasal spray Place 1 spray into both nostrils 2 (two) times daily. Use in each nostril as directed   Calcium  Carb-Cholecalciferol (CALCIUM  1000 + D PO) Take 1 tablet by mouth daily.   EPINEPHrine  (EPIPEN  2-PAK) 0.3 mg/0.3 mL IJ SOAJ injection Inject 0.3 mg into the muscle as needed for anaphylaxis.   ezetimibe  (ZETIA ) 10 MG tablet Take 1 tablet (10 mg total) by mouth daily.   fluticasone  (FLONASE ) 50 MCG/ACT nasal spray Place 2 sprays into both nostrils daily.   loratadine  (CLARITIN ) 10 MG tablet Take 1 tablet (10 mg total) by mouth daily. TAKE ONE (1) TABLET EACH DAY Strength: 10 mg   Multiple Vitamin (MULTIVITAMIN WITH MINERALS) TABS tablet Take 1  tablet by mouth daily.   Omega-3 Fatty Acids (FISH OIL) 1000 MG CAPS Take by mouth.   Facility-Administered Encounter Medications as of 09/11/2024  Medication   0.9 %  sodium chloride  infusion    History: Past Medical History:  Diagnosis Date   Acid reflux    Allergy    seasonal   Angio-edema    High cholesterol    Hypertension    Osteopenia    2006 -1.4, 2009 -1.9, 2011 -1.9   Past Surgical History:  Procedure Laterality Date   BREAST BIOPSY Left 2012   CATARACT EXTRACTION W/PHACO Left 03/22/2017   Procedure: CATARACT EXTRACTION PHACO AND INTRAOCULAR LENS PLACEMENT (IOC);  Surgeon: Perley Hamilton, MD;  Location: [facility];  Service: Ophthalmology;  Laterality: Left;  CDE:  8.65   CATARACT EXTRACTION W/PHACO Right 04/05/2017   Procedure: CATARACT EXTRACTION PHACO AND INTRAOCULAR LENS PLACEMENT (IOC);  Surgeon: Perley Hamilton, MD;  Location: [facility];  Service: Ophthalmology;  Laterality: Right;  CDE:  5.23    SKIN CANCER DESTRUCTION  2012   rt. lower leg   TUBAL LIGATION     Family History  Problem Relation Age of Onset   Heart disease Mother    Heart disease Father    Lung cancer Sister    Diabetes Brother    Heart disease Maternal Grandmother    Healthy Daughter    Healthy Son    Colon cancer Neg Hx    Stomach cancer Neg Hx    Esophageal cancer Neg Hx    Social History   Occupational History   Occupation: retired     Comment: solicitor of court - superior x 30 yrs   Tobacco Use   Smoking status: Never   Smokeless tobacco: Never  Vaping Use   Vaping status: Never Used  Substance and Sexual Activity   Alcohol use: No   Drug use: No   Sexual activity: Not Currently   Tobacco Counseling Counseling given: Yes  SDOH Screenings   Food Insecurity: No Food Insecurity (09/11/2024)  Housing: Unknown (09/11/2024)  Transportation Needs: No Transportation Needs (09/11/2024)  Utilities: Not At Risk (09/11/2024)  Alcohol Screen: Low Risk (09/09/2023)  Depression (PHQ2-9): Low  Risk (09/11/2024)  Financial Resource Strain: Low Risk (09/09/2023)  Physical Activity: Sufficiently Active (09/11/2024)  Social Connections: Socially Integrated (09/11/2024)  Stress: No Stress Concern Present (09/11/2024)  Tobacco Use: Low Risk (09/11/2024)  Health Literacy: Patient Unable To Answer (09/11/2024)   See flowsheets for full screening details  Depression Screen PHQ 2 & 9 Depression Scale- Over the past 2 weeks, how often have you been bothered by any of the following problems? Little interest or pleasure in doing things: 0 Feeling down, depressed, or hopeless (PHQ Adolescent also includes...irritable): 0 PHQ-2 Total Score: 0     Goals Addressed             This Visit's Progress    Prevent falls   On track    Stay active              Objective:    Today's Vitals   09/11/24 1304  BP: 127/84  Pulse: 84  Weight: 132 lb (59.9 kg)  Height: 5' 2 (1.575 m)   Body mass index is 24.14 kg/m.  Hearing/Vision screen No results found. Immunizations and Health Maintenance Health Maintenance  Topic Date Due   DTaP/Tdap/Td (1 - Tdap) 06/26/2025 (Originally 03/06/1965)   COVID-19 Vaccine (5 - 2025-26 season) 07/12/2025 (Originally 05/29/2024)   Medicare Annual Wellness (AWV)  09/11/2025   Colonoscopy  04/05/2026   Bone Density Scan  06/28/2026   Pneumococcal Vaccine: 50+ Years  Completed   Influenza Vaccine  Completed   Hepatitis C Screening  Completed   Zoster Vaccines- Shingrix  Completed   Meningococcal B Vaccine  Aged Out   Mammogram  Discontinued        Assessment/Plan:  This is a routine wellness examination for [facility].  Patient Care Team: Joesph Annabella HERO, FNP as PCP - General (Family Medicine) Neysa Inocente PARAS, NP (Inactive) as Nurse Practitioner (Obstetrics and Gynecology) Gladis Gearing, OD (Optometry) [facility]  I have personally reviewed and noted the following in the patients chart:   Medical and social  history Use of alcohol, tobacco or illicit drugs  Current medications and supplements including opioid prescriptions. Functional ability and status Nutritional status Physical activity Advanced directives List of other physicians Hospitalizations, surgeries, and ER visits in  previous 12 months Vitals Screenings to include cognitive, depression, and falls Referrals and appointments  No orders of the defined types were placed in this encounter.  In addition, I have reviewed and discussed with patient certain preventive protocols, quality metrics, and best practice recommendations. A written personalized care plan for preventive services as well as general preventive health recommendations were provided to patient.   Ozie Ned, CMA   09/11/2024   Return in 1 year (on 09/11/2025).  After Visit Summary: (MyChart) Due to this being a telephonic visit, the after visit summary with patients personalized plan was offered to patient via MyChart   Nurse Notes: n/a

## 2024-09-11 NOTE — Patient Instructions (Signed)
 Ms. Lansdowne,  Thank you for taking the time for your Medicare Wellness Visit. I appreciate your continued commitment to your health goals. Please review the care plan we discussed, and feel free to reach out if I can assist you further.  Please note that Annual Wellness Visits do not include a physical exam. Some assessments may be limited, especially if the visit was conducted virtually. If needed, we may recommend an in-person follow-up with your provider.  Ongoing Care Seeing your primary care provider every 3 to 6 months helps us  monitor your health and provide consistent, personalized care.   Referrals If a referral was made during today's visit and you haven't received any updates within two weeks, please contact the referred provider directly to check on the status.  Recommended Screenings:  Health Maintenance  Topic Date Due   Medicare Annual Wellness Visit  09/08/2024   DTaP/Tdap/Td vaccine (1 - Tdap) 06/26/2025*   COVID-19 Vaccine (5 - 2025-26 season) 07/12/2025*   Colon Cancer Screening  04/05/2026   Osteoporosis screening with Bone Density Scan  06/28/2026   Pneumococcal Vaccine for age over 62  Completed   Flu Shot  Completed   Hepatitis C Screening  Completed   Zoster (Shingles) Vaccine  Completed   Meningitis B Vaccine  Aged Out   Breast Cancer Screening  Discontinued  *Topic was postponed. The date shown is not the original due date.       09/11/2024    9:49 AM  Advanced Directives  Does Patient Have a Medical Advance Directive? No  Would patient like information on creating a medical advance directive? No - Patient declined    Vision: Annual vision screenings are recommended for early detection of glaucoma, cataracts, and diabetic retinopathy. These exams can also reveal signs of chronic conditions such as diabetes and high blood pressure.  Dental: Annual dental screenings help detect early signs of oral cancer, gum disease, and other conditions linked to overall  health, including heart disease and diabetes.  Please see the attached documents for additional preventive care recommendations.

## 2024-10-04 ENCOUNTER — Other Ambulatory Visit (HOSPITAL_BASED_OUTPATIENT_CLINIC_OR_DEPARTMENT_OTHER): Payer: Self-pay

## 2025-01-29 ENCOUNTER — Ambulatory Visit: Admitting: Family Medicine

## 2025-06-27 ENCOUNTER — Encounter: Payer: Self-pay | Admitting: Family Medicine

## 2025-09-12 ENCOUNTER — Ambulatory Visit
